# Patient Record
Sex: Female | Born: 1970 | Race: White | Hispanic: No | Marital: Married | Smoking: Former smoker
Health system: Southern US, Community
[De-identification: ages and names within clinical notes are randomized; demographics above are authoritative.]

## PROBLEM LIST (undated history)

## (undated) DIAGNOSIS — M1990 Unspecified osteoarthritis, unspecified site: Secondary | ICD-10-CM

## (undated) DIAGNOSIS — C4490 Unspecified malignant neoplasm of skin, unspecified: Secondary | ICD-10-CM

## (undated) DIAGNOSIS — I10 Essential (primary) hypertension: Secondary | ICD-10-CM

## (undated) DIAGNOSIS — C20 Malignant neoplasm of rectum: Secondary | ICD-10-CM

## (undated) HISTORY — DX: Essential (primary) hypertension: I10

## (undated) HISTORY — DX: Malignant neoplasm of rectum: C20

## (undated) HISTORY — DX: Unspecified osteoarthritis, unspecified site: M19.90

## (undated) HISTORY — PX: SKIN CANCER EXCISION: SHX779

## (undated) HISTORY — DX: Unspecified malignant neoplasm of skin, unspecified: C44.90

---

## 2003-04-08 ENCOUNTER — Other Ambulatory Visit: Admission: RE | Admit: 2003-04-08 | Discharge: 2003-04-08 | Payer: Self-pay | Admitting: Obstetrics and Gynecology

## 2004-07-10 ENCOUNTER — Other Ambulatory Visit: Admission: RE | Admit: 2004-07-10 | Discharge: 2004-07-10 | Payer: Self-pay | Admitting: Obstetrics and Gynecology

## 2008-05-14 ENCOUNTER — Encounter: Admission: RE | Admit: 2008-05-14 | Discharge: 2008-05-14 | Payer: Self-pay | Admitting: Gynecology

## 2019-02-23 DIAGNOSIS — R202 Paresthesia of skin: Secondary | ICD-10-CM | POA: Insufficient documentation

## 2019-07-18 ENCOUNTER — Encounter: Payer: Self-pay | Admitting: Nurse Practitioner

## 2019-07-24 ENCOUNTER — Institutional Professional Consult (permissible substitution): Payer: Self-pay | Admitting: Pulmonary Disease

## 2019-07-25 NOTE — Progress Notes (Signed)
07/25/2019 Kayla Hobbs [id number] 01/29/1971   CHIEF COMPLAINT: rectal bleeding   HISTORY OF PRESENT ILLNESS:  Kayla Hobbs is a 49 year old female with a past medical history of  anxiety and hypertension. S/P scalp excision for basal cell cancer one week ago. Past tubal ligation. She was referred to our office by her gynecologist, Dr. Dian Queen, for further evaluation regarding rectal bleeding.  She reports seeing bright red blood on the toilet tissue and intermittently in the toilet water 8 out of 10 bowel movements for the past year.  No severe bleeding. No associated anal or rectal pain.  No lower abdominal pain.  Her bowel pattern varies but she primarily passes a normal formed brown bowel movement every other day.  She has occasional constipation or loose stool.  One year ago she had frequent headaches for which she took Aleve, Ibuprofen, Goody powder or Tylenol  Daily for 6 months.  Her headaches improved therefore she is taking NSAIDs infrequently for the past 6 months.  No GERD or gastritis symptoms. Her weight is stable.  No family history of colorectal cancer.  No other complaints today.  Family history: Mother's history unknown.  Father age 81 with history of hypertension and hypercholesterolemia.  Paternal grandmother with diabetes.  Maternal grandmother died from pancreatic cancer in her 69s.  Social history: She is a Therapist, sports carrier.  She is divorced.  She has 3 daughters.  She previously smoked 10 cigarettes daily on and off for 10 years, she quit smoking 4 years ago.  She drinks a total of 10 glasses of wine or beer monthly.  No drug use.  Family History:  Mother. Father 34 HTN and elevated cholesterol. PGM diabetes. MGM died from pancreatic cancer 60's. No family history or esophageal, gastric or colorectal cancer.   Current Outpatient Medications on File Prior to Visit  Medication Sig Dispense Refill  . buPROPion (WELLBUTRIN SR) 150 MG 12 hr tablet Take 150 mg  by mouth 2 (two) times daily.    . busPIRone (BUSPAR) 10 MG tablet Take 10 mg by mouth 2 (two) times daily.    Marland Kitchen estradiol (VIVELLE-DOT) 0.05 MG/24HR patch Place 1 patch onto the skin 2 (two) times a week.    . fluticasone (FLONASE) 50 MCG/ACT nasal spray Place 1 spray into both nostrils daily.    . hydrochlorothiazide (HYDRODIURIL) 50 MG tablet Take 50 mg by mouth daily.    Marland Kitchen PROGESTERONE MICRONIZED PO Take 100 mg by mouth daily.    Marland Kitchen zolpidem (AMBIEN) 10 MG tablet Take 10 mg by mouth at bedtime as needed for sleep.     No current facility-administered medications on file prior to visit.   No Known Allergies  REVIEW OF SYSTEMS:  Gen: Denies fever, sweats or chills. No weight loss.  CV: Denies chest pain, palpitations or edema. Resp: Denies cough, shortness of breath of hemoptysis.  GI: See HPI. Denies heartburn, dysphagia, stomach or lower abdominal pain.  GU : Denies urinary burning, blood in urine, increased urinary frequency or incontinence. MS: Denies joint pain, muscles aches or weakness. Derm: Denies rash, itchiness, skin lesions or unhealing ulcers. Psych: + work related anxiety. No depression.  Heme: Denies bruising, bleeding. Neuro:  + Past headaches, dizziness or paresthesias. Endo:  Denies any problems with DM, thyroid or adrenal function.   PHYSICAL EXAM: BP 120/88   Pulse 87   Temp 97.8 F (36.6 C)   Ht 5\' 6"  (1.676 m)   Wt 154  lb (69.9 kg)   BMI 24.86 kg/m  General: Well developed  49 year old female in no acute distress. Head: Normocephalic and atraumatic. Eyes:  Sclerae non-icteric, conjunctive pink. Ears: Normal auditory acuity. Mouth: Dentition intact. No ulcers or lesions.  Neck: Supple, no lymphadenopathy or thyromegaly.  Lungs: Clear bilaterally to auscultation without wheezes, crackles or rhonchi. Heart: Regular rate and rhythm. No murmur, rub or gallop appreciated.  Abdomen: Soft, nontender, non distended. No masses. No hepatosplenomegaly.  Normoactive bowel sounds x 4 quadrants.  Rectal: No external hemorrhoids or fissures. Internal hemorrhoids palpated R > L. No blood or stool in the rectum. No mass.  Musculoskeletal: Symmetrical with no gross deformities. Skin: Warm and dry. No rash or lesions on visible extremities. Extremities: No edema. Neurological: Alert oriented x 4, no focal deficits.  Psychological:  Alert and cooperative. Normal mood and affect.  ASSESSMENT AND PLAN:  36. 49 year old female with hematochezia, nonbleeding internal hemorrhoids identified on rectal exam. -Colonoscopy benefits and risks discussed including risk with sedation, risk of bleeding, perforation and infection  -Request copy of CBC, CMP from Dr. Christen Butter office -Anusol [facility] 25mg  one suppository PR Q HS x 5 nights  -Patient to call our office if her rectal bleeding worsens -Further follow up to be determined after colonoscopy completed         CC:  Dian Queen, MD

## 2019-07-26 ENCOUNTER — Ambulatory Visit: Payer: 59 | Admitting: Nurse Practitioner

## 2019-07-26 ENCOUNTER — Encounter: Payer: Self-pay | Admitting: Nurse Practitioner

## 2019-07-26 VITALS — BP 120/88 | HR 87 | Temp 97.8°F | Ht 66.0 in | Wt 154.0 lb

## 2019-07-26 DIAGNOSIS — K921 Melena: Secondary | ICD-10-CM | POA: Diagnosis not present

## 2019-07-26 DIAGNOSIS — Z01818 Encounter for other preprocedural examination: Secondary | ICD-10-CM | POA: Diagnosis not present

## 2019-07-26 MED ORDER — NA SULFATE-K SULFATE-MG SULF 17.5-3.13-1.6 GM/177ML PO SOLN: 1.0000 | Freq: Once | ORAL | 0 refills | Status: AC

## 2019-07-26 MED ORDER — HYDROCORTISONE ACETATE 25 MG RE SUPP: 25.0000 mg | Freq: Every day | RECTAL | 0 refills | Status: AC

## 2019-07-26 NOTE — Patient Instructions (Addendum)
If you are age 51 or older, your body mass index should be between 23-30. Your Body mass index is 24.86 kg/m. If this is out of the aforementioned range listed, please consider follow up with your Primary Care Provider.  If you are age 74 or younger, your body mass index should be between 19-25. Your Body mass index is 24.86 kg/m. If this is out of the aformentioned range listed, please consider follow up with your Primary Care Provider.   We have sent the following medications to your pharmacy for you to pick up at your convenience: suprep (colonoscopy prep) Annusol suppositories use at bedtime for 5 nights  Please purchase the following medications over the counter and take as directed:  Please use Benefier 1 tablespoon daily Miralax as needed for constipation at bedtime.  Due to recent changes in healthcare laws, you may see the results of your imaging and laboratory studies on MyChart before your provider has had a chance to review them.  We understand that in some cases there may be results that are confusing or concerning to you. Not all laboratory results come back in the same time frame and the provider may be waiting for multiple results in order to interpret others.  Please give [place] 48 hours in order for your provider to thoroughly review all the results before contacting the office for clarification of your results.    Thank you for choosing [facility] Noralyn Pick, CRNP

## 2019-07-27 NOTE — Progress Notes (Signed)
Agree with assessment and plan as outlined.  

## 2019-08-17 ENCOUNTER — Institutional Professional Consult (permissible substitution): Payer: Self-pay | Admitting: Pulmonary Disease

## 2019-09-07 ENCOUNTER — Encounter: Payer: 59 | Admitting: Gastroenterology

## 2020-05-10 HISTORY — PX: PARTIAL HYSTERECTOMY: SHX80

## 2021-05-26 ENCOUNTER — Encounter: Payer: Self-pay | Admitting: Nurse Practitioner

## 2021-05-26 ENCOUNTER — Other Ambulatory Visit (INDEPENDENT_AMBULATORY_CARE_PROVIDER_SITE_OTHER): Payer: BLUE CROSS/BLUE SHIELD

## 2021-05-26 ENCOUNTER — Ambulatory Visit: Payer: BLUE CROSS/BLUE SHIELD | Admitting: Nurse Practitioner

## 2021-05-26 VITALS — BP 126/82 | HR 96 | Ht 64.0 in | Wt 158.6 lb

## 2021-05-26 DIAGNOSIS — K625 Hemorrhage of anus and rectum: Secondary | ICD-10-CM

## 2021-05-26 DIAGNOSIS — K648 Other hemorrhoids: Secondary | ICD-10-CM | POA: Diagnosis not present

## 2021-05-26 DIAGNOSIS — Z1211 Encounter for screening for malignant neoplasm of colon: Secondary | ICD-10-CM | POA: Insufficient documentation

## 2021-05-26 LAB — CBC
HCT: 42 % (ref 36.0–46.0)
Hemoglobin: 13.9 g/dL (ref 12.0–15.0)
MCHC: 33 g/dL (ref 30.0–36.0)
MCV: 92.6 fl (ref 78.0–100.0)
Platelets: 220 10*3/uL (ref 150.0–400.0)
RBC: 4.54 Mil/uL (ref 3.87–5.11)
RDW: 13.3 % (ref 11.5–15.5)
WBC: 6.3 10*3/uL (ref 4.0–10.5)

## 2021-05-26 MED ORDER — NA SULFATE-K SULFATE-MG SULF 17.5-3.13-1.6 GM/177ML PO SOLN: 1.0000 | ORAL | 0 refills | Status: DC

## 2021-05-26 NOTE — Patient Instructions (Addendum)
You have been scheduled for a colonoscopy. Please follow written instructions given to you at your visit today.  Please pick up your prep supplies at the pharmacy within the next 1-3 days. If you use inhalers (even only as needed), please bring them with you on the day of your procedure.  We have sent the following medications to your pharmacy for you to pick up at your convenience: [place] provider has requested that you go to the basement level for lab work before leaving today. Press "B" on the elevator. The lab is located at the first door on the left as you exit the elevator.   1.A high fiber diet with plenty of fluids (up to 8 glasses of water daily) is suggested to relieve these symptoms. Take Benefiber 1 tablespoon daily to help with having regular bowel movement daily.   2.Try Hardin Negus Bacteria Probiotic ( over the counter) 1 tablet by mouth once daily.   3.Apply pea size amount of Desitin inside the anal area three times daily as needed for Hemorrhoids.   If you are age 109 or older, your body mass index should be between 23-30. Your Body mass index is 27.22 kg/m. If this is out of the aforementioned range listed, please consider follow up with your Primary Care Provider.  If you are age 53 or younger, your body mass index should be between 19-25. Your Body mass index is 27.22 kg/m. If this is out of the aformentioned range listed, please consider follow up with your Primary Care Provider.   ________________________________________________________  The [facility] providers would like to encourage you to use [facility] to communicate with providers for non-urgent requests or questions.  Due to long hold times on the telephone, sending your provider a message by [facility] may be a faster and more efficient way to get a response.  Please allow 48 business hours for a response.  Please remember that this is for non-urgent requests.   _______________________________________________________  Due to recent changes in healthcare laws, you may see the results of your imaging and laboratory studies on MyChart before your provider has had a chance to review them.  We understand that in some cases there may be results that are confusing or concerning to you. Not all laboratory results come back in the same time frame and the provider may be waiting for multiple results in order to interpret others.  Please give [place] 48 hours in order for your provider to thoroughly review all the results before contacting the office for clarification of your results.   Thank you for choosing me and [facility].  Cyril

## 2021-05-26 NOTE — Progress Notes (Signed)
05/26/2021 Kayla Hobbs [id number] 07/06/1970   Chief Complaint: Schedule colonoscopy  History of Present Illness: Kayla Hobbs is a 51 year old female with a past medical history of anxiety and hypertension. Partial hysterectomy 05/2020, pat tubal ligation and scalp excision for basal cell cancer. I last saw her in office on 07/26/2019 due to having bright red rectal bleeding. She was prescribed Anusol suppositories for internal hemorrhoids and a colonoscopy was ordered but was not done. She presents to our office today to schedule colonoscopy.  She reports passing bright red blood daily when passing a bowel movement.  She describes seeing a small amount of bright red blood on the stool and on the toilet tissue.  No associated anorectal pain.  She sometimes sees blood on the toilet tissue when she urinates and does not pass any stool per the rectum.  She has generalized abdominal pain if she delays defecation.  She took 1 Anusol suppositories previously prescribed but did not feel comfortable administering the rest of the suppositories.  Her bowel pattern varies, she passes a solid or loose stool daily then no bowel movement for few days then the cycle repeats.  No known family history of colon polyps or colon cancer.  She takes Aleve 2 tabs as needed for aches and pains.  No GERD symptoms.  She is scheduled to see her new PCP Dr. Radford Pax at [facility] later today.  Past Medical History:  Diagnosis Date   Arthritis    Skin cancer    basil top of heasd    Past Surgical History:  Procedure Laterality Date   SKIN CANCER EXCISION     Current Outpatient Medications on File Prior to Visit  Medication Sig Dispense Refill   buPROPion (WELLBUTRIN SR) 150 MG 12 hr tablet Take 150 mg by mouth 2 (two) times daily.     busPIRone (BUSPAR) 10 MG tablet Take 10 mg by mouth 2 (two) times daily.     estradiol (ESTRACE) 0.5 MG tablet Take 0.5 mg by mouth daily.     hydrochlorothiazide  (HYDRODIURIL) 50 MG tablet Take 50 mg by mouth daily.     PROGESTERONE MICRONIZED PO Take 100 mg by mouth daily.     No current facility-administered medications on file prior to visit.   No Known Allergies  Current Medications, Allergies, Past Medical History, Past Surgical History, Family History and Social History were reviewed in [employer] record.  Review of Systems:   Constitutional: Negative for fever, sweats, chills or weight loss.  Respiratory: Negative for shortness of breath.   Cardiovascular: Negative for chest pain, palpitations and leg swelling.  Gastrointestinal: See HPI.  Musculoskeletal: Negative for back pain or muscle aches.  Neurological: Negative for dizziness, headaches or paresthesias.    Physical Exam: BP 126/82    Pulse 96    Ht 5\' 4"  (1.626 m)    Wt 158 lb 9.6 oz (71.9 kg)    BMI 27.22 kg/m   General: 51 year old female in no acute distress. Head: Normocephalic and atraumatic. Eyes: No scleral icterus. Conjunctiva pink . Ears: Normal auditory acuity. Mouth: Dentition intact. No ulcers or lesions.  Lungs: Clear throughout to auscultation. Heart: Regular rate and rhythm, no murmur. Abdomen: Soft, nontender and nondistended. No masses or hepatomegaly. Normal bowel sounds x 4 quadrants.  Rectal: Patient declined rectal exam, wishes to proceed with colonoscopy for further evaluation. Musculoskeletal: Symmetrical with no gross deformities. Extremities: No edema. Neurological: Alert oriented x  4. No focal deficits.  Psychological: Alert and cooperative. Normal mood and affect  Assessment and Recommendations:  76) 51 year old female with rectal bleeding, internal hemorrhoids.  Colonoscopy was previously recommended 07/2019 but was not done.  She wishes to schedule colonoscopy at this time. -Colonoscopy benefits and risks discussed including risk with sedation, risk of bleeding, perforation and infection  -Apply a small amount of Desitin  inside the anal opening and to the external anal area tid as needed for anal or hemorrhoidal irritation/bleeding.  -CBC -Patient to contact her office if her rectal bleeding worsens prior to her colonoscopy date  2) IBS symptoms with variable bowel pattern -Proceed with colonoscopy as ordered above -Benefiber 1 tablespoon daily as tolerated -Ph[place]ips bacteria probiotic once daily  Further recommendations to be determined after the above evaluation completed

## 2021-05-27 NOTE — Progress Notes (Signed)
Agree with assessment and plan as outlined.  

## 2021-06-18 ENCOUNTER — Encounter: Payer: Self-pay | Admitting: Gastroenterology

## 2021-06-18 ENCOUNTER — Ambulatory Visit (AMBULATORY_SURGERY_CENTER): Payer: BLUE CROSS/BLUE SHIELD | Admitting: Gastroenterology

## 2021-06-18 VITALS — BP 120/79 | HR 77 | Temp 98.0°F | Resp 11 | Ht 64.0 in | Wt 158.0 lb

## 2021-06-18 DIAGNOSIS — D125 Benign neoplasm of sigmoid colon: Secondary | ICD-10-CM

## 2021-06-18 DIAGNOSIS — C20 Malignant neoplasm of rectum: Secondary | ICD-10-CM | POA: Diagnosis not present

## 2021-06-18 DIAGNOSIS — D128 Benign neoplasm of rectum: Secondary | ICD-10-CM

## 2021-06-18 DIAGNOSIS — D122 Benign neoplasm of ascending colon: Secondary | ICD-10-CM | POA: Diagnosis not present

## 2021-06-18 DIAGNOSIS — A630 Anogenital (venereal) warts: Secondary | ICD-10-CM

## 2021-06-18 DIAGNOSIS — K625 Hemorrhage of anus and rectum: Secondary | ICD-10-CM | POA: Diagnosis present

## 2021-06-18 MED ORDER — SODIUM CHLORIDE 0.9 % IV SOLN: 500.0000 mL | Freq: Once | INTRAVENOUS | Status: DC

## 2021-06-18 NOTE — Progress Notes (Signed)
Pt awake, report to RN, VVS  °

## 2021-06-18 NOTE — Progress Notes (Signed)
History and Physical Interval Note: Patient here for colonoscopy to evaluate rectal bleeding which has persistent since the office visit on 05/26/21. No interval changes since her visit. She feels well otherwise without complaints. She thinks bleeding is due to hemorrhoids, but will confirm with colonoscopy, this is her first exam. No FH of CRC. Otherwise feels well.   06/18/2021 3:45 PM  Kayla Hobbs  has presented today for endoscopic procedure(s), with the diagnosis of  Encounter Diagnosis  Name Primary?   Rectal bleeding Yes  .  The various methods of evaluation and treatment have been discussed with the patient and/or family. After consideration of risks, benefits and other options for treatment, the patient has consented to  the endoscopic procedure(s).   [name] history has been reviewed, patient examined, no change in status, stable for surgery.  I have reviewed the patient's chart and labs.  Questions were answered to the patient's satisfaction.    Jolly Mango, MD [facility]

## 2021-06-18 NOTE — Progress Notes (Signed)
Called to room to assist during endoscopic procedure.  Patient ID and intended procedure confirmed with present staff. Received instructions for my participation in the procedure from the performing physician.  

## 2021-06-18 NOTE — Progress Notes (Signed)
Pt's states no medical or surgical changes since previsit or office visit.   Vs dt.

## 2021-06-18 NOTE — Op Note (Signed)
[facility] Patient Name: Kayla Hobbs Procedure Date: 06/18/2021 3:43 PM MRN: [id number] Endoscopist: Remo Lipps P. Havery [name] , MD Age: 51 Referring MD:  Date of Birth: 05-03-1971 Gender: Female Account #: [id number] Procedure:                Colonoscopy Indications:              Rectal bleeding, first time colonoscopy Medicines:                Monitored Anesthesia Care Procedure:                Pre-Anesthesia Assessment:                           - Prior to the procedure, a History and Physical                            was performed, and patient medications and                            allergies were reviewed. [name] tolerance of                            previous anesthesia was also reviewed. The risks                            and benefits of the procedure and the sedation                            options and risks were discussed with the patient.                            All questions were answered, and informed consent                            was obtained. Prior Anticoagulants: The patient has                            taken no previous anticoagulant or antiplatelet                            agents. ASA Grade Assessment: II - A patient with                            mild systemic disease. After reviewing the risks                            and benefits, the patient was deemed in                            satisfactory condition to undergo the procedure.                           After obtaining informed consent, the colonoscope  was passed under direct vision. Throughout the                            procedure, the patient's blood pressure, pulse, and                            oxygen saturations were monitored continuously. The                            PCF-HQ190L Colonoscope was introduced through the                            anus and advanced to the the cecum, identified by                            appendiceal  orifice and ileocecal valve. The                            colonoscopy was performed without difficulty. The                            patient tolerated the procedure well. The quality                            of the bowel preparation was good. The ileocecal                            valve, appendiceal orifice, and rectum were                            photographed. Scope In: 3:48:43 PM Scope Out: 4:24:28 PM Scope Withdrawal Time: 0 hours 22 minutes 12 seconds  Total Procedure Duration: 0 hours 35 minutes 45 seconds  Findings:                 The digital rectal exam findings include polypoid                            lesion.                           A diminutive polyp was found in the ascending                            colon. The polyp was sessile. The polyp was removed                            with a cold biopsy forceps. Resection and retrieval                            were complete.                           A 5 mm polyp was found in the sigmoid colon. The  polyp was sessile. The polyp was removed with a                            cold snare. Resection and retrieval were complete.                           A large polyp was found in the distal rectum (3cm                            or so) within 2-3 cm of the dentate line. The polyp                            appeared to be pedunculated but with a very short                            stalk. The stalk was successfully injected with 8                            mL of a 1:100,000 solution of epinephrine for drug                            delivery during cecal intubation to shrink the head                            of it and lift it. The polyp was then removed with                            a hot snare on Endocut setting during withdrawal.                            Resection and retrieval were complete. Area on                            either side of the polypectomy site was tattooed                             with an injection of Spot (carbon black). To                            prevent bleeding after the polypectomy, two                            hemostatic clips were successfully placed across                            the polypectomy site.                           Small polypoid lesions were found in the distal                            rectum along the dentate line. The  lesions were                            sessile. Unclear if condyloma vs. small adenomas.                            Biopsies were taken with a cold forceps for                            histology.                           Internal hemorrhoids were found during                            retroflexion. The hemorrhoids were small.                           The exam was otherwise without abnormality. Several                            minutes spent preparing epinephrine for injection                            during cecal intubation, which prolonged cecal                            intubation, which was not challenging. Complications:            No immediate complications. Estimated blood loss:                            Minimal. Estimated Blood Loss:     Estimated blood loss was minimal. Impression:               - Polypoid lesion found on digital rectal exam.                           - One diminutive polyp in the ascending colon,                            removed with a cold biopsy forceps. Resected and                            retrieved.                           - One 5 mm polyp in the sigmoid colon, removed with                            a cold snare. Resected and retrieved.                           - One large polyp in the distal rectum. Resected  and retrieved as outlined. Injected / tattooed.                            Clips were placed.                           - Polypoid lesions in the distal rectum. Biopsied.                           - Internal  hemorrhoids.                           - The examination was otherwise normal. Recommendation:           - Patient has a contact number available for                            emergencies. The signs and symptoms of potential                            delayed complications were discussed with the                            patient. Return to normal activities tomorrow.                            Written discharge instructions were provided to the                            patient.                           - Resume previous diet.                           - Continue present medications.                           - Await pathology results.                           - No ibuprofen, naproxen, or other non-steroidal                            anti-inflammatory drugs for 2 weeks after polyp                            removal. Remo Lipps P. Rosalio Catterton, MD 06/18/2021 4:37:14 PM This report has been signed electronically.

## 2021-06-18 NOTE — Patient Instructions (Signed)
Handout given for polyps.  No NSAIDS (Non-Steroidal anti-inflammatory drugs) for 2 weeks.  (These include, aspirin, aspirin-containing products, ibuprofen, advil, motrin, naproxen, aleve, goody powders, etc) Tylenol is ok to take as needed, see label for instructions.    YOU HAD AN ENDOSCOPIC PROCEDURE TODAY AT [facility]:   Refer to the procedure report that was given to you for any specific questions about what was found during the examination.  If the procedure report does not answer your questions, please call your gastroenterologist to clarify.  If you requested that your care partner not be given the details of your procedure findings, then the procedure report has been included in a sealed envelope for you to review at your convenience later.  YOU SHOULD EXPECT: Some feelings of bloating in the abdomen. Passage of more gas than usual.  Walking can help get rid of the air that was put into your GI tract during the procedure and reduce the bloating. If you had a lower endoscopy (such as a colonoscopy or flexible sigmoidoscopy) you may notice spotting of blood in your stool or on the toilet paper. If you underwent a bowel prep for your procedure, you may not have a normal bowel movement for a few days.  Please Note:  You might notice some irritation and congestion in your nose or some drainage.  This is from the oxygen used during your procedure.  There is no need for concern and it should clear up in a day or so.  SYMPTOMS TO REPORT IMMEDIATELY:  Following lower endoscopy (colonoscopy):  Excessive amounts of blood in the stool  Significant tenderness or worsening of abdominal pains  Swelling of the abdomen that is new, acute  Fever of 100F or higher  For urgent or emergent issues, a gastroenterologist can be reached at any hour by calling [phone]. Do not use MyChart messaging for urgent concerns.    DIET:  We do recommend a small meal at first, but then you may  proceed to your regular diet.  Drink plenty of fluids but you should avoid alcoholic beverages for 24 hours.  ACTIVITY:  You should plan to take it easy for the rest of today and you should NOT DRIVE, NO WORK or use heavy machinery until tomorrow (because of the sedation medicines used during the test).    FOLLOW UP: Our staff will call the number listed on your records Monday 06/22/21 between 715-8AM following your procedure to check on you and address any questions or concerns that you may have regarding the information given to you following your procedure. If we do not reach you, we will leave a message.  We will attempt to reach you two times.  During this call, we will ask if you have developed any symptoms of COVID 19. If you develop any symptoms (ie: fever, flu-like symptoms, shortness of breath, cough etc.) before then, please call [phone].  If you test positive for Covid 19 in the 2 weeks post procedure, please call and report this information to [place].     The polyps and biopsies that were taken are sent to the pathology lab and you will be contacted by phone or by letter within the next 1-3 weeks.  Please call us at [phone] if you have not heard about the biopsies in 3 weeks.    SIGNATURES/CONFIDENTIALITY: You and/or your care partner have signed paperwork which will be entered into your electronic medical record.  These signatures attest to the fact  that that the information above on your After Visit Summary has been reviewed and is understood.  Full responsibility of the confidentiality of this discharge information lies with you and/or your care-partner.

## 2021-06-22 ENCOUNTER — Telehealth: Payer: Self-pay

## 2021-06-22 NOTE — Telephone Encounter (Signed)
°  Follow up Call-  Call back number 06/18/2021  Post procedure Call Back phone  # [phone]  Permission to leave phone message Yes  Some recent data might be hidden     Patient questions:  Do you have a fever, pain , or abdominal swelling? No. Pain Score  0 *  Have you tolerated food without any problems? Yes.    Have you been able to return to your normal activities? Yes.    Do you have any questions about your discharge instructions: Diet   No. Medications  No. Follow up visit  No.  Do you have questions or concerns about your Care? No.  Actions: * If pain score is 4 or above: No action needed, pain <4.

## 2021-06-26 ENCOUNTER — Telehealth: Payer: Self-pay | Admitting: Oncology

## 2021-06-26 ENCOUNTER — Telehealth: Payer: Self-pay | Admitting: Nurse Practitioner

## 2021-06-26 ENCOUNTER — Other Ambulatory Visit: Payer: Self-pay

## 2021-06-26 DIAGNOSIS — C20 Malignant neoplasm of rectum: Secondary | ICD-10-CM

## 2021-06-26 NOTE — Telephone Encounter (Signed)
Scheduled appt per 2/17 referral. Pt is aware of appt date and time. Pt is aware to arrive 15 mins prior to appt time and to bring and updated insurance card. Pt is aware of appt location.   °

## 2021-07-02 ENCOUNTER — Other Ambulatory Visit: Payer: Self-pay

## 2021-07-02 ENCOUNTER — Ambulatory Visit (HOSPITAL_COMMUNITY)
Admission: RE | Admit: 2021-07-02 | Discharge: 2021-07-02 | Disposition: A | Discharge status: Home / Self Care | Payer: BLUE CROSS/BLUE SHIELD | Source: Ambulatory Visit | Attending: Gastroenterology | Admitting: Gastroenterology

## 2021-07-02 ENCOUNTER — Encounter (HOSPITAL_COMMUNITY): Payer: Self-pay

## 2021-07-02 DIAGNOSIS — C20 Malignant neoplasm of rectum: Secondary | ICD-10-CM | POA: Diagnosis present

## 2021-07-02 LAB — POCT I-STAT CREATININE: Creatinine, Ser: 1.2 mg/dL — ABNORMAL HIGH (ref 0.44–1.00)

## 2021-07-02 MED ORDER — IOHEXOL 300 MG/ML  SOLN
100.0000 mL | Freq: Once | INTRAMUSCULAR | Status: AC | PRN
  Administered 2021-07-02: 100 mL via INTRAVENOUS

## 2021-07-02 MED ORDER — SODIUM CHLORIDE (PF) 0.9 % IJ SOLN
INTRAMUSCULAR | Status: AC
  Filled 2021-07-02: qty 50

## 2021-07-03 ENCOUNTER — Ambulatory Visit (HOSPITAL_COMMUNITY)
Admission: RE | Admit: 2021-07-03 | Discharge: 2021-07-03 | Disposition: A | Discharge status: Home / Self Care | Payer: BLUE CROSS/BLUE SHIELD | Source: Ambulatory Visit | Attending: Gastroenterology | Admitting: Gastroenterology

## 2021-07-03 DIAGNOSIS — C20 Malignant neoplasm of rectum: Secondary | ICD-10-CM | POA: Insufficient documentation

## 2021-07-03 MED ORDER — GADOBUTROL 1 MMOL/ML IV SOLN
7.0000 mL | Freq: Once | INTRAVENOUS | Status: AC | PRN
  Administered 2021-07-03: 7 mL via INTRAVENOUS

## 2021-07-06 ENCOUNTER — Other Ambulatory Visit (HOSPITAL_COMMUNITY): Payer: BLUE CROSS/BLUE SHIELD

## 2021-07-08 ENCOUNTER — Encounter: Payer: Self-pay | Admitting: Nurse Practitioner

## 2021-07-08 ENCOUNTER — Other Ambulatory Visit: Payer: Self-pay

## 2021-07-08 ENCOUNTER — Telehealth: Payer: Self-pay | Admitting: Radiation Oncology

## 2021-07-08 ENCOUNTER — Inpatient Hospital Stay: Payer: BLUE CROSS/BLUE SHIELD | Attending: Nurse Practitioner | Admitting: Nurse Practitioner

## 2021-07-08 DIAGNOSIS — Z806 Family history of leukemia: Secondary | ICD-10-CM | POA: Diagnosis not present

## 2021-07-08 DIAGNOSIS — Z87891 Personal history of nicotine dependence: Secondary | ICD-10-CM | POA: Insufficient documentation

## 2021-07-08 DIAGNOSIS — F1090 Alcohol use, unspecified, uncomplicated: Secondary | ICD-10-CM | POA: Diagnosis not present

## 2021-07-08 DIAGNOSIS — Z800 Family history of malignant neoplasm of digestive organs: Secondary | ICD-10-CM | POA: Diagnosis not present

## 2021-07-08 DIAGNOSIS — I10 Essential (primary) hypertension: Secondary | ICD-10-CM | POA: Insufficient documentation

## 2021-07-08 DIAGNOSIS — Z808 Family history of malignant neoplasm of other organs or systems: Secondary | ICD-10-CM | POA: Insufficient documentation

## 2021-07-08 DIAGNOSIS — C20 Malignant neoplasm of rectum: Secondary | ICD-10-CM | POA: Diagnosis present

## 2021-07-08 DIAGNOSIS — F1910 Other psychoactive substance abuse, uncomplicated: Secondary | ICD-10-CM | POA: Insufficient documentation

## 2021-07-08 NOTE — Progress Notes (Addendum)
I met with Ms Kayla Hobbs and her husband after her consultation with Cira Rue, NP and Dr Burr Medico.   I explained my role as a nurse navigator and provided my contact information.  I explained the services provided at [facility] and provided written information.  All questions were answered.  They verbalized understanding. ?I spoke with Rosann Auerbach at [facility] laboratories and requested MMR testing on her biopsy specimen. ? ?

## 2021-07-08 NOTE — Progress Notes (Signed)
The proposed treatment discussed in conference is for discussion purpose only and is not a binding recommendation.  The patients have not been physically examined, or presented with their treatment options.  Therefore, final treatment plans cannot be decided.  

## 2021-07-08 NOTE — Progress Notes (Addendum)
[facility]   Telephone:([phone]) [phone] [phone]) [phone]   Clinic New Consult Note   Patient Care Team: Patient, No Pcp Per (Inactive) as PCP - General (General Practice) Romie Levee, MD as Consulting Physician (General Surgery) Malachy Mood, MD as Consulting Physician (Hematology) Marily Lente, RN as Registered Nurse Pollyann Samples, NP as Nurse Practitioner (Nurse Practitioner) 07/08/2021  CHIEF COMPLAINTS/PURPOSE OF CONSULTATION:  Rectal cancer, referred by colorectal surgeon Dr. Romie Levee  SUMMARY OF ONCOLOGY HISTORY  Oncology History  Rectal cancer (HCC)  06/18/2021 Cancer Staging   Staging form: Colon and Rectum, AJCC 8th Edition - Clinical stage from 06/18/2021: Stage I (cT1, cN0, cM0) - Signed by Malachy Mood, MD on 07/11/2021 Stage prefix: Initial diagnosis Total positive nodes: 0 Histologic grade (G): G2 Histologic grading system: 4 grade system    06/18/2021 Procedure   Colonoscopy per Dr. Adela Lank, impression - Polypoid lesion found on digital rectal exam. - One diminutive polyp in the ascending colon, removed with a cold biopsy forceps. Resected and retrieved. - One 5 mm polyp in the sigmoid colon, removed with a cold snare. Resected and retrieved. - One large polyp in the distal rectum. Resected and retrieved as outlined. Injected/tattooed. Clips were placed. - Polypoid lesions in the distal rectum. Biopsied. - Internal hemorrhoids. - The examination was otherwise normal.   06/18/2021 Initial Biopsy   Diagnosis 1. Surgical [P], colon, distal rectal biopsy - CONDYLOMA ACUMINATA 2. Surgical [P], colon, ascending, sigmoid, polyp (2) - TUBULAR ADENOMA (2 OF 2 FRAGMENTS) - NO HIGH-GRADE DYSPLASIA OR MALIGNANCY IDENTIFIED 3. Surgical [P], colon, rectum, polyp (1) - ADENOCARCINOMA ARISING IN A TUBULAR ADENOMA WITH HIGH-GRADE DYSPLASIA - SEE COMMENT Microscopic Comment 3. The invasive adenocarcinoma appears moderately differentiated and invades the  submucosa for a depth of 0.8 cm. The adenocarcinoma comes within 0.1 cm from the base of the polyp. High-grade features including lymphovascular space invasion, tumor budding or poor differentiation are not identified. Dr. Kenard Gower reviewed the case and agrees with the above diagnosis.   07/02/2021 Imaging   CT CAP IMPRESSION: Negative. No evidence of metastatic disease or other significant abnormality.   07/03/2021 Imaging   Pelvic MRI w/wo contrast IMPRESSION: Rectal adenocarcinoma T stage: T2.  Lesion not identified  Rectal adenocarcinoma N stage:  N0  Distance from tumor to the internal anal sphincter. Lesion not identified   07/08/2021 Initial Diagnosis   Rectal cancer (HCC)      HISTORY OF PRESENTING ILLNESS:  Kayla Hobbs 51 y.o. female with no significant past medical history except HTN is here because of newly diagnosed rectal cancer.  Initially seen by GI 07/26/2019 for BRBPR, prescribed Anusol but did not take it, and was scheduled for a colonoscopy but canceled it.  She has had persistent rectal bleeding since then with irregular bowel movements and occasional abdominal cramping.  She was seen back by GI 05/26/2021.  CBC was normal that day.  She underwent colonoscopy by Dr. Adela Lank 06/18/2021 which showed a polypoid lesion on the DRE and a large 3 cm pedunculated polyp on a stalk at the distal rectum within 2-3 cm of the dentate line, and multiple other polyps.  Resection and retrieval were felt to be complete.  The rectal polyp path showed moderately differentiated adenocarcinoma invading into the submucosa to a depth of 8 mm arising in a tubular adenoma with high-grade dysplasia.  Path also showed anal condyloma acuminata, and ascending and sigmoid polyps were tubular adenomas.  Staging CT CAP negative for local regional  adenopathy or distant metastasis.  Pelvic MRI 07/03/2021 showed T2N0, although there was no identifiable primary lesion, this likely represents inflammation from  polypectomy per tumor board discussion on 07/08/2021.  She was seen by colorectal surgeon Dr. Romie Levee.  Socially, she is recently married for 1 year, has 3 grown healthy daughters the oldest of whom is 60.  She smoked cigarettes intermittently for 10-15 years, quit 15 years ago at the most smoked 5 cigarettes/day.  She drinks wine daily after work.  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  She is independent with ADLs and up-to-date on mammogram.  She had a hysterectomy 1 year ago for heavy bleeding and takes Estrace for hot flashes.  Family history is significant for a cousin with leukemia, MGM with pancreatic cancer, and a half brother (share a father) with liver cancer likely HCC.  Patient had genetic testing at her OB/[facility]N [facility].   Today, she presents with her spouse. Rectal bleeding stopped with polypectomy, but was daily before that. Takes a multivitamin. She has irregular BMs, with soft to loose stools.  She has healed well from polypectomy.  Denies rectal pain, n/v, abdominal pain/bloating, unintentional weight loss, fatigue, fever, chills, cough, chest pain, dyspnea.     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  Tobacco Use   Smoking status: Former    Types: Cigarettes   Smokeless tobacco: Never   Tobacco comments:    Smoked intermittently up to 5 cig per day for 10-15 years, quit 5 years ago  Haematologist Use: Never used  Substance and Sexual Activity   Alcohol use: Yes    Comment: wine, daily   Drug use: Never   Sexual activity: Yes  Other Topics Concern   Not on file  Social History Narrative   Not on file   Social Determinants of Health   Financial Resource Strain: Not on file  Food Insecurity: Not on file  Transportation Needs: Not on file  Physical Activity: Not on file  Stress: Not on file  Social Connections: Not on file  Intimate Partner Violence: Not on file    FAMILY HISTORY: Family History  Problem Relation Age of Onset   Heart disease Father    Cancer Brother        liver cancer possibly HCC   Cancer Maternal Grandmother        pancreas   Pancreatic cancer Maternal Grandmother    Diabetes Paternal Grandmother    Heart disease Paternal Grandfather    Cancer Cousin        leukemia   Colon cancer Neg Hx    Stomach cancer Neg Hx    Esophageal cancer Neg Hx     ALLERGIES:  has No Known Allergies.  MEDICATIONS:  Current Outpatient Medications  Medication Sig  Dispense Refill   buPROPion (WELLBUTRIN SR) 150 MG 12 hr tablet Take 150 mg by mouth 2 (two) times daily.     busPIRone (BUSPAR) 10 MG tablet Take 10 mg by mouth 2 (two) times daily.     estradiol (ESTRACE) 0.5 MG tablet Take 0.5 mg by mouth daily.     hydrochlorothiazide (HYDRODIURIL) 50 MG tablet Take 50 mg by mouth daily.     SUMAtriptan (IMITREX) 50 MG tablet Take 50 mg by mouth daily as needed. (Patient not taking: Reported on 07/08/2021)     No current facility-administered medications for this visit.    REVIEW OF SYSTEMS:   Constitutional: Denies fatigue, unintentional weight loss, fevers, chills or abnormal night sweats Eyes: Denies blurriness of vision, double vision or watery eyes Ears, nose, mouth, throat, and face: Denies mucositis or sore throat Respiratory: Denies cough, dyspnea or wheezes Cardiovascular: Denies palpitation, chest discomfort or lower extremity  swelling Gastrointestinal:  Denies nausea, vomiting, constipation, diarrhea, heartburn or change in bowel habits (+) BRBPR x1 year (+) irregular bowels, soft-loose Skin: Denies abnormal skin rashes Lymphatics: Denies new lymphadenopathy or easy bruising Neurological:Denies numbness, tingling or new weaknesses Behavioral/Psych: Mood is stable, no new changes  All other systems were reviewed with the patient and are negative.  PHYSICAL EXAMINATION: ECOG PERFORMANCE STATUS: 0 - Asymptomatic  Vitals:   07/08/21 1110  BP: (!) 121/94  Pulse: 90  Resp: 15  Temp: 97.8 F (36.6 C)  SpO2: 100%   Filed Weights   07/08/21 1110  Weight: 161 lb (73 kg)    GENERAL:alert, no distress and comfortable SKIN: No rash EYES: sclera clear NECK: Without mass LYMPH:  no palpable cervical or supraclavicular lymphadenopathy  LUNGS: clear with normal breathing effort HEART: regular rate & rhythm, no lower extremity edema ABDOMEN:abdomen soft, non-tender and normal bowel sounds Musculoskeletal:no cyanosis of digits and no clubbing  PSYCH: alert & oriented x 3 with fluent speech NEURO: no focal motor/sensory deficits Anorectal exam deferred   LABORATORY DATA:  I have reviewed the data as listed CBC Latest Ref Rng & Units 05/26/2021  WBC 4.0 - 10.5 K/uL 6.3  Hemoglobin 12.0 - 15.0 g/dL 34.7  Hematocrit 42.5 - 46.0 % 42.0  Platelets 150.0 - 400.0 K/uL 220.0    RADIOGRAPHIC STUDIES: I have personally reviewed the radiological images as listed and agreed with the findings in the report. MR PELVIS W WO CONTRAST  Result Date: 07/07/2021 CLINICAL DATA:  Large polypoid lesion removed from the rectum. Lesion reported 2-3 cm from the dentate line. EXAM: MRI PELVIS WITH and  WITHOUT CONTRAST TECHNIQUE: Multiplanar multisequence MR imaging of the pelvis was performed. Without contrast ultrasound gel was administered per rectum to optimize tumor evaluation. COMPARISON:  None. CONTRAST:  7 mL Gadavist  FINDINGS: TUMOR LOCATION No discrete lesion identified following polypectomy. Polypectomy within 2-3 cm the dentate line by report. Tumor distance from Anal Verge/Skin Surface: Lesion not identified Tumor distance to Internal Anal Sphincter: Lesion not identified TUMOR DESCRIPTION Circumferential Extent: Lesion knots identified Tumor Length: Lesion not identified cm T - CATEGORY Extension through Muscularis Propria: No equal T2 Shortest Distance of any tumor/node from Mesorectal Fascia: Lesion not identified mm Extramural Vascular Invasion/Tumor Thrombus: No Invasion of Anterior Peritoneal Reflection: No Involvement of Adjacent Organs or Pelvic Sidewall: No Levator Ani Involvement: No N - CATEGORY Mesorectal Lymph Nodes >=57mm: None equal in 0 Extra-mesorectal Lymphadenopathy: No Other: Small 10 mm cyst in the LEFT adnexal region. Post hysterectomy IMPRESSION: Rectal adenocarcinoma T stage: T2.  Lesion not identified Rectal adenocarcinoma N stage:  N0 Distance from tumor to the internal anal sphincter. Lesion not identified Electronically Signed   By: Genevive Bi M.D.   On: 07/07/2021 08:46   CT CHEST ABDOMEN PELVIS W CONTRAST  Result Date: 07/03/2021 CLINICAL DATA:  Newly diagnosed rectal carcinoma.  Staging. EXAM: CT CHEST, ABDOMEN, AND PELVIS WITH CONTRAST TECHNIQUE: Multidetector CT imaging of the chest, abdomen and pelvis was performed following the standard protocol during bolus administration of intravenous contrast. RADIATION DOSE REDUCTION: This exam was performed according to the departmental dose-optimization program which includes automated exposure control, adjustment of the mA and/or kV according to patient size and/or use of iterative reconstruction technique. CONTRAST:  OMNIPAQUE IOHEXOL 300 MG/ML  SOLN COMPARISON:  None. FINDINGS: CT CHEST FINDINGS Cardiovascular: No acute findings. Mediastinum/Lymph Nodes: No masses or pathologically enlarged lymph nodes identified. Lungs/Pleura: No  suspicious pulmonary nodules or masses identified. No evidence of infiltrate or pleural effusion. Musculoskeletal:  No suspicious bone lesions identified. CT ABDOMEN AND PELVIS FINDINGS Hepatobiliary: Probable tiny sub-cm cyst noted in posterior right hepatic lobe. No other liver lesions identified. Gallbladder is unremarkable. No evidence of biliary ductal dilatation. Pancreas:  No mass or inflammatory changes. Spleen:  Within normal limits in size and appearance. Adrenals/Urinary tract:  No masses or hydronephrosis. Stomach/Bowel: No focal soft tissue masses identified. No evidence of obstruction, inflammatory process, or abnormal fluid collections. Normal appendix visualized. Vascular/Lymphatic: No pathologically enlarged lymph nodes identified. No acute vascular findings. Reproductive: Prior hysterectomy noted. Adnexal regions are unremarkable in appearance. Other:  None. Musculoskeletal:  No suspicious bone lesions identified. IMPRESSION: Negative. No evidence of metastatic disease or other significant abnormality. Electronically Signed   By: Danae Orleans M.D.   On: 07/03/2021 14:28    ASSESSMENT & PLAN: 51 year old female  Moderately differentiated rectal adenocarcinoma, G2, T1N0M0 stage I -we reviewed her work up including colonoscopy, CT, MRI, and pathology in detail with the patient and family.  -She presented with rectal bleeding in 07/2019, colonoscopy was canceled by the patient. She had persistent bleeding and presented back to GI 06/2021, work up showed a distal rectal polyp palpable on DRE. Resection was complete, path showed moderately differentiated adenocarcinoma invading submucosa to a dept of 8 mm, with a close (0.1 cm) but negative margin.   -There were no other higher grade features such as lymphovascular space invasion, tumor budding, or poor differentiation.  We reviewed the concern is that at this depth, there is approximately 20% chance there is microscopic residual/nodal  disease -Staging CT CAP was negative for local regional adenopathy or distant metastasis. -Staging pelvic MRI showed local inflammation but no identifiable lesion, N0 -Her case was discussed in our GI conference today 07/08/2021, the options include either upfront surgery vs chemo RT and observation, vs observation alone which would include flex sigmoidoscopy plus CT/MRI every 3-6 months for the first 3 years out.  -Dr. Maisie Fus reviewed this would be a complex surgery with the need for at least temporary, possibly permanent, colostomy.  Patient would like to avoid surgery if possible -We reviewed logistics of chemoradiation with Xeloda given twice daily on M-F days with radiation.  Potential side effects including but not limited to fatigue, low appetite, hand/foot syndrome, skin toxicities, nausea, diarrhea, renal/liver dysfunction, and cytopenias were reviewed in detail. -Patient seen with Dr. Mosetta Putt who reviewed chemoRT could potentially reduce recurrence risk by 70-80%, but the benefit is not guaranteed.   -Patient is concerned about taking aggressive treatment in the absence  of obvious identifiable cancer.  She is also concerned with sexual side effects given she is recently married to her husband. -She would like to learn more about radiation and possible long term side effects, we have referred her to rad onc -she is also considering observation alone.  -We are requesting MMR testing on the biopsy to see if she is a candidate for immunotherapy if she has recurrent/metastatic disease in the future -she will call us with her decision after meeting with Dr. Mitzi Hansen and Dr. Maisie Fus again.   HTN -on HCTZ   Substance use -she started Welbutrin to quit smoking, quit 5 years ago, and continues welbutrin. Also on Buspar -she drinks wine nightly  -will review cessation at a later date  Genetics -Due to her family history of pancreatic cancer she is alible for genetic testing  -patient had genetic testing  [facility], [facility] negative.  We will request the report -Patient has 3 healthy daughters, oldest is 57.  Recommendation is for them to begin colorectal cancer screening at age 34 (10 years younger than patient at her diagnosis), or sooner if they have for personal red flags  PLAN: -Work-up reviewed -Patient considering options including surgery vs chemo RT then observation, vs observation alone -Request MMR testing on tissue biopsy -Referral to Dr. Mitzi Hansen in rad onc -Follow-up with Dr. Maisie Fus for surgical discussion -Patient will call to let us know her decision after meeting with her care team -Patient seen with Dr. Mosetta Putt   Orders Placed This Encounter  Procedures   Ambulatory referral to Radiation Oncology    Referral Priority:   Routine    Referral Type:   Consultation    Referral Reason:   Specialty Services Required    Requested Specialty:   Radiation Oncology    Number of Visits Requested:   1    All questions were answered. The patient knows to call the clinic with any problems, questions or concerns.     Pollyann Samples, NP 07/08/2021    Addendum  I have seen the patient, examined her. I agree with the assessment and and plan and have edited the notes.   51 yo female presented with intermittent rectal bleeding for 2 years, and recently underwent colonoscopy and was found to have a large 3 cm pedunculated polyp on a stalk at the distal rectum within 2-3 cm of the dentate line, status post endoscopic resection.  The rectal polyp path showed moderately differentiated adenocarcinoma invading into the submucosa to a depth of 8 mm arising in a tubular adenoma with high-grade dysplasia, case was discussed in GI tumor conference today, this was felt to be SM3 lesion, pelvic MRI and CT scan was negative for residual disease or adenopathy.  Although the resection margin was negative, and no residual disease on image, we discussed the risk of microscopic  disease in nodes is about 20%.  We discussed the option of surgical resection (with the possibility of permanent ostomy), concurrent chemoradiation, versus observation with close monitoring (endoscopy every 3 to 6 months and image every 6 months).  We discussed the option of chemoradiation with oral Xeloda in detail, and the potential side effects from Xeloda and radiation.  Patient does not want surgery, initially was leaning towards observation, but after lengthy discussion, she is open to chemoradiation, and would like to discuss with rad/onc.  Will make an urgent referral. If she agrees with chemRT, plan to start in a few weeks. All questions were answered.  Malachy Mood  07/11/2021

## 2021-07-08 NOTE — Telephone Encounter (Signed)
LVM to sched consult ?

## 2021-07-09 ENCOUNTER — Telehealth: Payer: Self-pay | Admitting: Radiation Oncology

## 2021-07-09 NOTE — Telephone Encounter (Signed)
Spoke with pt who is not sure of spouse's schedule for them to come together. Telephone and mychart consult options were offered but pt seemed unsure. Pt agreed to c/b with decision for consult dates offered.  ?

## 2021-07-10 ENCOUNTER — Ambulatory Visit: Payer: BLUE CROSS/BLUE SHIELD | Admitting: Oncology

## 2021-07-10 NOTE — Progress Notes (Signed)
Kayla Hobbs called stating she wants to move forward with concurrent chemotherapy and radiation therapy.  She has not made her consult appt with Dr Lisbeth Renshaw yet.  I explained she will need to meet with Dr Lisbeth Renshaw as he manages the radiation treatments not Dr Burr Medico.  I explained that Wells Guiles our oral chemotherapy pharmacist will reach out to her regarding copay and which pharmacy we will need to use as well as provide chemotherapy education.  I tyold her I would follow behind the scenes to ensure all appropriate appts are made.  I transferred her call to rad/onc schedulers.  I have notified Dr Burr Medico of her decision.  ?

## 2021-07-11 ENCOUNTER — Other Ambulatory Visit (HOSPITAL_COMMUNITY): Payer: Self-pay

## 2021-07-11 ENCOUNTER — Encounter: Payer: Self-pay | Admitting: Nurse Practitioner

## 2021-07-11 MED ORDER — CAPECITABINE 500 MG PO TABS
825.0000 mg/m2 | ORAL_TABLET | Freq: Two times a day (BID) | ORAL | 1 refills | Status: DC
  Filled 2021-07-11: qty 90, 15d supply, fill #0

## 2021-07-13 ENCOUNTER — Telehealth: Payer: Self-pay | Admitting: Pharmacist

## 2021-07-13 ENCOUNTER — Telehealth: Payer: Self-pay

## 2021-07-13 ENCOUNTER — Other Ambulatory Visit (HOSPITAL_COMMUNITY): Payer: Self-pay

## 2021-07-13 DIAGNOSIS — C20 Malignant neoplasm of rectum: Secondary | ICD-10-CM

## 2021-07-13 MED ORDER — CAPECITABINE 500 MG PO TABS: 825.0000 mg/m2 | ORAL_TABLET | Freq: Two times a day (BID) | ORAL | 1 refills | Status: DC

## 2021-07-13 NOTE — Telephone Encounter (Addendum)
Oral Oncology Pharmacist Encounter ? ?Received new prescription for Xeloda (capecitabine) for the treatment of rectal cancer in conjunction with radiation, planned for duration of radiation therapy. ? ?Patient has CBC from 05/26/21 that's WNL - patient will have f/u labs prior to starting Xeloda. Prescription dose and frequency assessed for appropriateness.  ? ?Current medication list in Epic reviewed, no relevant/significant DDIs with Xeloda identified. ? ?Evaluated chart and no patient barriers to medication adherence noted.  ? ?Patient's insurance requires Xeloda to be filled through [facility]. Prescription redirected for dispensing. ? ?Oral Oncology Clinic will continue to follow for insurance authorization, copayment issues, initial counseling and start date. ? ?Leron Croak, PharmD, BCPS ?Hematology/Oncology Clinical Pharmacist ?[facility] Oral Chemotherapy Navigation Clinics ?[phone] ?07/13/2021 8:39 AM ? ?

## 2021-07-13 NOTE — Progress Notes (Signed)
Per Dr Burr Medico request.  Lab appt made on 07/15/2021 at 1045.  Kayla Hobbs aware of appt. ?

## 2021-07-13 NOTE — Telephone Encounter (Signed)
Oral Oncology Patient Advocate Encounter ?  ?Received notification from [employer] that prior authorization for Xeloda is required. ?  ?PA submitted on CoverMyMeds ?[place] ?Status is pending ?  ?Oral Oncology Clinic will continue to follow. ? ? ?Wynn Maudlin CPHT ?Specialty Pharmacy Patient Advocate ?[place] ?Phone [phone] ?Fax [phone] ?07/13/2021 8:49 AM ? ?

## 2021-07-13 NOTE — Progress Notes (Signed)
Radiation Oncology         [phone] ________________________________  Name: Kayla Hobbs        MRN: [id number]  Date of Service: 07/15/2021 DOB: 1970-07-25  ER:[id number], No Pcp Per (Inactive)  Truitt Merle, MD     REFERRING PHYSICIAN: Truitt Merle, MD   DIAGNOSIS: The encounter diagnosis was Rectal cancer [facility]).   HISTORY OF PRESENT ILLNESS: Kayla Hobbs is a 51 y.o. female seen at the request of Dr. Burr Medico for rectal cancer. The patient initially presented due to symptoms of of bright red blood per rectum.  She was treated for hemorrhoids in 2021 and was encouraged to have colonoscopy but this was not performed.  She returned to schedule a colonoscopy in January 2023.  This was performed on 06/18/2021 and revealed for a diagnosis of rectal cancer.  The procedure revealed a polypoid lesion felt on digital rectal exam, there was additionally a diminutive polyp in the ascending colon that was biopsied, a 5 mm polyp in the sigmoid colon that was sessile and removed with cold snare, and then the large palpable polyp in the distal rectum 2 to 3 cm from the dentate line was noted pedunculated on a short stalk, and the polyp was removed with hot snare.  Small polypoid lesions were also seen in the distal rectum along the dentate line unclear if condyloma versus small adenomas and biopsies were also taken.  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  There were features including lymphovascular space invasion.  She underwent an MRI of the pelvis and CT staging of the chest abdomen pelvis.  Her CT staging was negative for any visible disease, her MRI of the pelvis showed a T2 lesion extending through the muscularis and no visible mesorectal or pelvic sidewall adenopathy.  Her case has  been discussed in multidisciplinary gastrointestinal oncology conference, she is interested in avoiding surgical resection, and has met with Dr. Burr Medico to discuss options of chemoradiation.  She is motivated to consider this modality of therapy which providers are in agreement with and she is seen to discuss this today.   PREVIOUS RADIATION THERAPY: No   PAST MEDICAL HISTORY:  Past Medical History:  Diagnosis Date   Arthritis    Hypertension    Skin cancer    basil top of heasd        PAST SURGICAL HISTORY: Past Surgical History:  Procedure Laterality Date   PARTIAL HYSTERECTOMY  05/2020   due to heavy bleeding   SKIN CANCER EXCISION       FAMILY HISTORY:  Family History  Problem Relation Age of Onset   Heart disease Father    Cancer Brother        liver cancer possibly [place]   Cancer Maternal Grandmother        pancreas   Pancreatic cancer Maternal Grandmother    Diabetes Paternal Grandmother    Heart disease Paternal Grandfather    Cancer Cousin        leukemia   Colon cancer Neg Hx    Stomach cancer Neg Hx    Esophageal cancer Neg Hx      SOCIAL HISTORY:  reports that she has quit smoking. Her smoking use included cigarettes. She has never used smokeless tobacco. She reports current alcohol use. She reports  that she does not use drugs. The patient is married and lives in [place], [place]. She works for the [employer] [place]    ALLERGIES: Patient has no known allergies.   MEDICATIONS:  Current Outpatient Medications  Medication Sig Dispense Refill   buPROPion (WELLBUTRIN SR) 150 MG 12 hr tablet Take 150 mg by mouth 2 (two) times daily.     busPIRone (BUSPAR) 10 MG tablet Take 10 mg by mouth 2 (two) times daily.     capecitabine (XELODA) 500 MG tablet Take 3 tablets (1,500 mg total) by mouth 2 (two) times daily after a meal. On days of radiation, Monday through Friday. 90 tablet 1   estradiol (ESTRACE) 0.5 MG tablet Take 0.5 mg by  mouth daily.     hydrochlorothiazide (HYDRODIURIL) 50 MG tablet Take 50 mg by mouth daily.     SUMAtriptan (IMITREX) 50 MG tablet Take 50 mg by mouth daily as needed. (Patient not taking: Reported on 07/08/2021)     No current facility-administered medications for this encounter.     REVIEW OF SYSTEMS: On review of systems, the patient reports that she is doing okay. She denies any rectal bleeding or drainage. She is not having bladder dysfunction but does have days of constipation and altering days at times of diarrhea. She denies any pelvic pain. She has had a hysterectomy in the past but has been through menopause. 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 No other complaints are verbalized.  PHYSICAL EXAM:  Wt Readings from Last 3 Encounters:  07/15/21 159 lb 12.8 oz (72.5 kg)  07/08/21 161 lb (73 kg)  06/18/21 158 lb (71.7 kg)   Temp Readings from Last 3 Encounters:  07/15/21 97.8 F (36.6 C)  07/08/21 97.8 F (36.6 C) (Temporal)  06/18/21 98 F (36.7 C) (Temporal)   BP Readings from Last 3 Encounters:  07/15/21 (!) 146/94  07/08/21 (!) 121/94  06/18/21 120/79   Pulse Readings from Last 3 Encounters:  07/15/21 80  07/08/21 90  06/18/21 77   Pain Assessment Pain Score: 0-No pain/10  In general this is a well appearing caucasian female in no acute distress. She's alert and oriented x4 and appropriate throughout the examination. Cardiopulmonary assessment is negative for acute distress and she exhibits normal effort.     ECOG = 1  0 - Asymptomatic (Fully active, able to carry on all predisease activities without restriction)  1 - Symptomatic but completely ambulatory (Restricted in physically strenuous activity but ambulatory and able to carry out work of a light or sedentary nature. For example, light housework, office work)  2 - Symptomatic, <50% in bed during the day (Ambulatory and  capable of all self care but unable to carry out any work activities. Up and about more than 50% of waking hours)  3 - Symptomatic, >50% in bed, but not bedbound (Capable of only limited self-care, confined to bed or chair 50% or more of waking hours)  4 - Bedbound (Completely disabled. Cannot carry on any self-care. Totally confined to bed or chair)  5 - Death   Eustace Pen MM, Creech RH, Tormey DC, et al. [phone]). "Toxicity and response criteria of the [facility]". [place] Oncol. 5 (6): 649-55    LABORATORY DATA:  Lab Results  Component Value Date   WBC 5.2 07/15/2021   HGB 14.4 07/15/2021   HCT 40.3  07/15/2021   MCV 89.0 07/15/2021   PLT 210 07/15/2021   No results found for: NA, K, CL, CO2 No results found for: ALT, AST, GGT, ALKPHOS, BILITOT    RADIOGRAPHY: MR PELVIS W WO CONTRAST  Result Date: 07/07/2021 CLINICAL DATA:  Large polypoid lesion removed from the rectum. Lesion reported 2-3 cm from the dentate line. EXAM: MRI PELVIS WITH and  WITHOUT CONTRAST TECHNIQUE: Multiplanar multisequence MR imaging of the pelvis was performed. Without contrast ultrasound gel was administered per rectum to optimize tumor evaluation. COMPARISON:  None. CONTRAST:  7 mL Gadavist FINDINGS: TUMOR LOCATION No discrete lesion identified following polypectomy. Polypectomy within 2-3 cm the dentate line by report. Tumor distance from Anal Verge/Skin Surface: Lesion not identified Tumor distance to Internal Anal Sphincter: Lesion not identified TUMOR DESCRIPTION Circumferential Extent: Lesion knots identified Tumor Length: Lesion not identified cm T - CATEGORY Extension through Muscularis Propria: No equal T2 Shortest Distance of any tumor/node from Mesorectal Fascia: Lesion not identified mm Extramural Vascular Invasion/Tumor Thrombus: No Invasion of Anterior Peritoneal Reflection: No Involvement of Adjacent Organs or Pelvic Sidewall: No Levator Ani Involvement: No N - CATEGORY  Mesorectal Lymph Nodes >=73m: None equal in 0 Extra-mesorectal Lymphadenopathy: No Other: Small 10 mm cyst in the LEFT adnexal region. Post hysterectomy IMPRESSION: Rectal adenocarcinoma T stage: T2.  Lesion not identified Rectal adenocarcinoma N stage:  N0 Distance from tumor to the internal anal sphincter. Lesion not identified Electronically Signed   By: SSuzy BouchardM.D.   On: 07/07/2021 08:46   CT CHEST ABDOMEN PELVIS W CONTRAST  Result Date: 07/03/2021 CLINICAL DATA:  Newly diagnosed rectal carcinoma.  Staging. EXAM: CT CHEST, ABDOMEN, AND PELVIS WITH CONTRAST TECHNIQUE: Multidetector CT imaging of the chest, abdomen and pelvis was performed following the standard protocol during bolus administration of intravenous contrast. RADIATION DOSE REDUCTION: This exam was performed according to the departmental dose-optimization program which includes automated exposure control, adjustment of the mA and/or kV according to patient size and/or use of iterative reconstruction technique. CONTRAST:  1074mOMNIPAQUE IOHEXOL 300 MG/ML  SOLN COMPARISON:  None. FINDINGS: CT CHEST FINDINGS Cardiovascular: No acute findings. Mediastinum/Lymph Nodes: No masses or pathologically enlarged lymph nodes identified. Lungs/Pleura: No suspicious pulmonary nodules or masses identified. No evidence of infiltrate or pleural effusion. Musculoskeletal:  No suspicious bone lesions identified. CT ABDOMEN AND PELVIS FINDINGS Hepatobiliary: Probable tiny sub-cm cyst noted in posterior right hepatic lobe. No other liver lesions identified. Gallbladder is unremarkable. No evidence of biliary ductal dilatation. Pancreas:  No mass or inflammatory changes. Spleen:  Within normal limits in size and appearance. Adrenals/Urinary tract:  No masses or hydronephrosis. Stomach/Bowel: No focal soft tissue masses identified. No evidence of obstruction, inflammatory process, or abnormal fluid collections. Normal appendix visualized. Vascular/Lymphatic:  No pathologically enlarged lymph nodes identified. No acute vascular findings. Reproductive: Prior hysterectomy noted. Adnexal regions are unremarkable in appearance. Other:  None. Musculoskeletal:  No suspicious bone lesions identified. IMPRESSION: Negative. No evidence of metastatic disease or other significant abnormality. Electronically Signed   By: JoMarlaine Hind.D.   On: 07/03/2021 14:28       IMPRESSION/PLAN: 1. Stage I, cT2N0M0 adenocarcinoma of the distal rectum. Dr. MoLisbeth Renshawiscusses the pathology findings and reviews the nature of local rectal disease. Her case has been discussed in multidisciplinary GI oncology conference and her tumor is considered malignant, and while surgery is the gold standard, she is not interested in losing bowel function and having an end colostomy. Rather we discussed the alternative with  chemoradiation followed by aggressive surveillance.  We discussed the risks, benefits, short, and long term effects of radiotherapy, as well as the curative intent, and the patient is interested in proceeding. Dr. Lisbeth Renshaw discusses the delivery and logistics of radiotherapy and anticipates a course of 5 1/2 weeks of radiotherapy to the pelvis. She will be contacted to coordinate treatment planning by our simulation department. Written consent is obtained and placed in the chart, a copy was provided to the patient. We anticipate treatment will start on 07/27/21. 2. Financial concerns related to loss of work and concerns of being able to do her job. [name] role certainly has limitations in access to a restroom which is the greatest concern during radiation. We will submit her paperwork through our HIM nurse Barnet Glasgow, RN and also request social work referral to help navigate options of disability.  3. 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 She will follow along with Dr. Marcello Moores for this as well.   In a visit lasting 60 minutes, greater than 50% of the time was spent face to face discussing the patient's condition, in preparation for the discussion, and coordinating the patient's care.   The above documentation reflects my direct findings during this shared patient visit. Please see the separate note by Dr. Lisbeth Renshaw on this date for the remainder of the patient's plan of care.    Carola Rhine, [facility]   **Disclaimer: This note was dictated with voice recognition software. Similar sounding words can inadvertently be transcribed and this note may contain transcription errors which may not have been corrected upon publication of note.**

## 2021-07-13 NOTE — Progress Notes (Signed)
GI Location of Tumor / Histology: Rectal Cancer ? ?Kayla Hobbs initially presented in March 2021 with rectal bleeding, colonoscopy was canceled by the patient.  She had persistent bleeding and presented back to GI 06/2021, work up showed distal rectal polyp palpable on digital rectal exam. ? ?MRI Pelvis 07/03/2021: ?Rectal adenocarcinoma T stage: T2.  Lesion not identified ?  ?Rectal adenocarcinoma N stage:  N0 ?  ?Distance from tumor to the internal anal sphincter. Lesion not ?identified ? ? ?CT CAP 07/02/2021: Negative.  No evidence of metastatic disease or other significant abnormality. ? ?Colonoscopy 06/18/2021: Polypoid lesion found on digital rectal exam.  One diminutive polyp in the ascending colon, removed with a cold biopsy forceps. Resected and retrieved.  One 5 mm polyp in the sigmoid colon, removed with a cold snare. Resected and retrieved.  One large polyp in the distal rectum. Resected and retrieved as outlined. Injected/tattooed.  Clips were placed.  Polypoid lesions in the distal rectum. Biopsied.  Internal hemorrhoids.  The examination was otherwise normal. ? ? ?Biopsies of Rectal Mass 06/18/2021 ? ? ?Past/Anticipated interventions by surgeon, if any:  ?Dr. 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 Given that she will undergo RT, we will re-examine after treatment to evaluate for regression. ?-Patient had a pedunculated lesion removed from the distal rectum. -Pathology is favorable except for a short margin during resection and depth of invasion. Imaging has been negative.  ?-We discussed this in GI tumor board and the recommendation was made to proceed with chemoRT as definitive treatment and then placed the patient on watch and wait surveillance.  ?-She will need routine imaging and exams afterwards.  ? ? ?Past/Anticipated interventions by medical oncology, if any:  ?Cira Rue / Dr. Burr Medico 07/08/2021 ?-Her case was discussed in our GI conference  today 07/08/2021, the options include either upfront surgery vs chemo RT and observation, vs observation alone which would include flex sigmoidoscopy plus CT/MRI every 3-6 months for the first 3 years out.  ?-Dr. Marcello Moores reviewed this would be a complex surgery with the need for at least temporary, possibly permanent, colostomy.  Patient would like to avoid surgery if possible ?-We reviewed logistics of chemoradiation with Xeloda given twice daily on M-F days with radiation. ?-Patient is concerned about taking aggressive treatment in the absence of obvious identifiable cancer.  She is also concerned with sexual side effects given she is recently married to her husband. ?-She would like to learn more about radiation and possible long term side effects, we have referred her to rad onc ?-she is also considering observation alone.  ?-We are requesting MMR testing on the biopsy to see if she is a candidate for immunotherapy if she has recurrent/metastatic disease in the future ?-she will call us with her decision after meeting with Dr. Lisbeth Renshaw and Dr. Marcello Moores again. ? ? ? ?Weight changes, if any: No ? ?Bowel/Bladder complaints, if any: Fluctuates from loose to normal bowels.  Denies bladder changes. ? ?Nausea / Vomiting, if any: No ? ?Pain issues, if any: No  ? ?Any blood per rectum: No   ? ?SAFETY ISSUES: ?Prior radiation? No ?Pacemaker/ICD? No ?Possible current pregnancy? Hysterectomy ?Is the patient on methotrexate? No  ? ?Current Complaints/Details: ? ?

## 2021-07-13 NOTE — Telephone Encounter (Signed)
Oral Oncology Patient Advocate Encounter ? ?Prior Authorization for Xeloda has been approved.   ? ?PA# [id number] ?Effective dates: 06/13/21 through 07/13/22 ? ?Patient must use [facility] ? ?Oral Oncology Clinic will continue to follow.  ? ?Wynn Maudlin CPHT ?Specialty Pharmacy Patient Advocate ?[place] ?Phone [phone] ?Fax [phone] ?07/13/2021 8:50 AM ? ?

## 2021-07-13 NOTE — Telephone Encounter (Signed)
Oral Oncology Patient Advocate Encounter ?  ?Received notification from [place] that prior authorization for Xeloda is required. ?  ?PA submitted on CoverMyMeds ?[place] [id number] ?Status is pending ?  ?Oral Oncology Clinic will continue to follow. ? ? ?Wynn Maudlin CPHT ?Specialty Pharmacy Patient Advocate ?[place] ?Phone [phone] ?Fax [phone] ?07/13/2021 8:52 AM ? ?

## 2021-07-14 ENCOUNTER — Encounter: Payer: Self-pay | Admitting: Nurse Practitioner

## 2021-07-14 ENCOUNTER — Other Ambulatory Visit: Payer: Self-pay

## 2021-07-14 NOTE — Telephone Encounter (Signed)
Oral Oncology Patient Advocate Encounter ? ?Prior Authorization for Xeloda has been approved.   ? ?PA# [id number] ?Effective dates: 07/13/21 through 07/14/22 ? ?Patient must use [facility] ? ?Oral Oncology Clinic will continue to follow.  ? ?Wynn Maudlin CPHT ?Specialty Pharmacy Patient Advocate ?[place] ?Phone [phone] ?Fax [phone] ?07/14/2021 8:46 AM ? ?

## 2021-07-15 ENCOUNTER — Ambulatory Visit
Admission: RE | Admit: 2021-07-15 | Discharge: 2021-07-15 | Disposition: A | Discharge status: Home / Self Care | Payer: BLUE CROSS/BLUE SHIELD | Source: Ambulatory Visit | Attending: Radiation Oncology | Admitting: Radiation Oncology

## 2021-07-15 ENCOUNTER — Inpatient Hospital Stay: Payer: BLUE CROSS/BLUE SHIELD

## 2021-07-15 ENCOUNTER — Encounter: Payer: Self-pay | Admitting: Radiation Oncology

## 2021-07-15 ENCOUNTER — Telehealth: Payer: Self-pay | Admitting: *Deleted

## 2021-07-15 ENCOUNTER — Other Ambulatory Visit: Payer: Self-pay

## 2021-07-15 ENCOUNTER — Other Ambulatory Visit (HOSPITAL_COMMUNITY): Payer: Self-pay

## 2021-07-15 VITALS — BP 146/94 | HR 80 | Temp 97.8°F | Resp 20 | Ht 64.0 in | Wt 159.8 lb

## 2021-07-15 DIAGNOSIS — Z85828 Personal history of other malignant neoplasm of skin: Secondary | ICD-10-CM | POA: Diagnosis not present

## 2021-07-15 DIAGNOSIS — I10 Essential (primary) hypertension: Secondary | ICD-10-CM | POA: Insufficient documentation

## 2021-07-15 DIAGNOSIS — Z800 Family history of malignant neoplasm of digestive organs: Secondary | ICD-10-CM | POA: Diagnosis not present

## 2021-07-15 DIAGNOSIS — C20 Malignant neoplasm of rectum: Secondary | ICD-10-CM | POA: Diagnosis present

## 2021-07-15 DIAGNOSIS — Z87891 Personal history of nicotine dependence: Secondary | ICD-10-CM | POA: Insufficient documentation

## 2021-07-15 DIAGNOSIS — Z79899 Other long term (current) drug therapy: Secondary | ICD-10-CM | POA: Diagnosis not present

## 2021-07-15 DIAGNOSIS — M129 Arthropathy, unspecified: Secondary | ICD-10-CM | POA: Insufficient documentation

## 2021-07-15 DIAGNOSIS — A630 Anogenital (venereal) warts: Secondary | ICD-10-CM | POA: Diagnosis not present

## 2021-07-15 LAB — CMP (CANCER CENTER ONLY)
ALT: 17 U/L (ref 0–44)
AST: 20 U/L (ref 15–41)
Albumin: 4.2 g/dL (ref 3.5–5.0)
Alkaline Phosphatase: 85 U/L (ref 38–126)
Anion gap: 7 (ref 5–15)
BUN: 13 mg/dL (ref 6–20)
CO2: 35 mmol/L — ABNORMAL HIGH (ref 22–32)
Calcium: 10.1 mg/dL (ref 8.9–10.3)
Chloride: 99 mmol/L (ref 98–111)
Creatinine: 0.96 mg/dL (ref 0.44–1.00)
GFR, Estimated: >60 mL/min (ref >60)
Glucose, Bld: 93 mg/dL (ref 70–99)
Potassium: 3 mmol/L — ABNORMAL LOW (ref 3.5–5.1)
Sodium: 141 mmol/L (ref 135–145)
Total Bilirubin: 0.7 mg/dL (ref 0.3–1.2)
Total Protein: 6.9 g/dL (ref 6.5–8.1)

## 2021-07-15 LAB — CBC WITH DIFFERENTIAL (CANCER CENTER ONLY)
Abs Immature Granulocytes: 0.01 10*3/uL (ref 0.00–0.07)
Basophils Absolute: 0 10*3/uL (ref 0.0–0.1)
Basophils Relative: 1 %
Eosinophils Absolute: 0.1 10*3/uL (ref 0.0–0.5)
Eosinophils Relative: 2 %
HCT: 40.3 % (ref 36.0–46.0)
Hemoglobin: 14.4 g/dL (ref 12.0–15.0)
Immature Granulocytes: 0 %
Lymphocytes Relative: 41 %
Lymphs Abs: 2.1 10*3/uL (ref 0.7–4.0)
MCH: 31.8 pg (ref 26.0–34.0)
MCHC: 35.7 g/dL (ref 30.0–36.0)
MCV: 89 fL (ref 80.0–100.0)
Monocytes Absolute: 0.6 10*3/uL (ref 0.1–1.0)
Monocytes Relative: 12 %
Neutro Abs: 2.2 10*3/uL (ref 1.7–7.7)
Neutrophils Relative %: 44 %
Platelet Count: 210 10*3/uL (ref 150–400)
RBC: 4.53 MIL/uL (ref 3.87–5.11)
RDW: 13 % (ref 11.5–15.5)
WBC Count: 5.2 10*3/uL (ref 4.0–10.5)
nRBC: 0 % (ref 0.0–0.2)

## 2021-07-15 NOTE — Telephone Encounter (Signed)
Connected with Toriana Sponsel [phone] (home) regarding FMLA paperwork received for spouse Carmelina Paddock addressed to navigator.     ? ?This forms nurse advised [name] of need for spouse to inform Bebe Liter to submit new FMLA form addressed correctly to legal name "Peters" not an individual. ?Per H.I.P.A.A. Guidelines, electronic, oral or paper release of health information (PHI) to a third party requires patient sign the "[place] for Use/Disclosure" form addressed to specific named party requesting use of patient PHI.    ?Prepared ROI form currently left with main office entry receptionist desk.  Awaiting Ariza Evans return to [facility] to sign. ?Currently no further questions or needs.    ?

## 2021-07-16 ENCOUNTER — Other Ambulatory Visit: Payer: Self-pay | Admitting: Hematology

## 2021-07-16 ENCOUNTER — Encounter: Payer: Self-pay | Admitting: Licensed Clinical Social Worker

## 2021-07-16 MED ORDER — POTASSIUM CHLORIDE CRYS ER 20 MEQ PO TBCR: 20.0000 meq | EXTENDED_RELEASE_TABLET | Freq: Two times a day (BID) | ORAL | 1 refills | Status: DC

## 2021-07-16 NOTE — Progress Notes (Signed)
[place] Clinical Social Work  ?Initial Assessment ? ? ?Kayla Hobbs is a 51 y.o. year old female contacted by phone. Clinical Social Work was referred by nurse navigator for assessment of psychosocial needs.  ? ?SDOH (Social Determinants of Health) assessments performed: Yes ?  ?Distress Screen completed: No ?ONCBCN DISTRESS SCREENING 07/15/2021  ?Screening Type Initial Screening  ?Distress experienced in past week (1-10) 8  ?Emotional problem type Nervousness/Anxiety;Adjusting to illness  ? ? ? ? ?Family/Social Information:  ?Housing Arrangement: patient lives with spouse. ?Family members/support persons in your life? 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 Income source: Employment ?Financial concerns: Yes, due to illness and/or loss of work during treatment ?Type of concern:  Pt states she got married a year ago and her husband takes care of the mortgage and house bills; however, pt is financially responsible for a personal loan which she will be unable to pay if she is not working through treatment. ?Food access concerns: no ?Religious or spiritual practice: none identified ?Services Currently in place:  none ? ?Coping/ Adjustment to diagnosis: ?Patient understands treatment plan and what happens next? yes ?Concerns about diagnosis and/or treatment: Relationship with husband or partner ?Patient reported stressors: Finances ?Hopes and priorities: Pt's priority is to complete radiation treatment w/ the hope of eliminating the cancer and moving forward. ?Patient enjoys time with family/ friends ?Current coping skills/ strengths: Supportive family/friends  ? ? ? SUMMARY: ?Current SDOH Barriers:  ?Financial constraints related to loss of work during treatment. ? ?Clinical Social Work Clinical Goal(s):  ?patient will work with SW to address concerns related to  financial concerns. ? ?Interventions: ?Discussed common feeling and emotions when being diagnosed with cancer, and the importance of support during treatment ?Informed patient of the support team roles and support services at [facility] ?Provided CSW contact information and encouraged patient to call with any questions or concerns ?Referred patient to financial resources to determine eligibility for grant assistance.   ? ? ?Follow Up Plan: CSW will follow-up with patient by phone to provide emotional support as appropriate and continually assess for needs throughout duration of treatment. ?Patient verbalizes understanding of plan: Yes ? ? ? ?Henriette Combs, LCSW ?

## 2021-07-17 ENCOUNTER — Ambulatory Visit
Admission: RE | Admit: 2021-07-17 | Discharge: 2021-07-17 | Disposition: A | Discharge status: Home / Self Care | Payer: BLUE CROSS/BLUE SHIELD | Source: Ambulatory Visit | Attending: Radiation Oncology | Admitting: Radiation Oncology

## 2021-07-17 ENCOUNTER — Other Ambulatory Visit: Payer: Self-pay

## 2021-07-17 DIAGNOSIS — C20 Malignant neoplasm of rectum: Secondary | ICD-10-CM | POA: Insufficient documentation

## 2021-07-17 DIAGNOSIS — Z510 Encounter for antineoplastic radiation therapy: Secondary | ICD-10-CM | POA: Insufficient documentation

## 2021-07-17 NOTE — Telephone Encounter (Signed)
Oral Chemotherapy Pharmacist Encounter ? ?I spoke with patient for overview of: Xeloda for the treatment of rectal cancer in conjunction with radiation, planned duration 5 1/2 weeks.  ? ?Counseled patient on administration, dosing, side effects, monitoring, drug-food interactions, safe handling, storage, and disposal. ? ?Patient will take Xeloda '500mg'$  tablets, 3 tablets ('1500mg'$ ) by mouth in AM and 3 tabs ('1500mg'$ ) by mouth in PM, within 30 minutes of finishing meals, on days of radiation only. ? ?Xeloda and radiation start date: 07/27/21 ? ?Adverse effects of Xeloda include but are not limited to: fatigue, decreased blood counts, GI upset, diarrhea, mouth sores, and hand-foot syndrome. ? ?Patient will obtain anti diarrheal and alert the office of 4 or more loose stools above baseline.  ? ?Reviewed with patient importance of keeping a medication schedule and plan for any missed doses. No barriers to medication adherence identified. ? ?Medication reconciliation performed and medication/allergy list updated. ? ?Insurance authorization for Xeloda has been obtained. Patient's insurance requires Xeloda to be filled through [facility]. This was shipped from their pharmacy on 07/17/21. ? ?All questions answered. ? ?Ms. Rought voiced understanding and appreciation.  ? ?Medication education handout and and medication calendar placed at front desk of [facility] for patient to pick up prior to rad/onc appt on 07/17/21. Patient knows to call the office with questions or concerns. Oral Chemotherapy Clinic phone number provided to patient.  ? ?Leron Croak, PharmD, BCPS ?Hematology/Oncology Clinical Pharmacist ?[facility] Oral Chemotherapy Navigation Clinics ?[phone] ?07/17/2021 11:32 AM ? ?

## 2021-07-17 NOTE — Progress Notes (Signed)
Per Dr Ernestina Penna request I spoke with Ms Occhipinti regarding her low potassium level.  I let her know that a prescription for potassium was sent to her pharmacy.  She is to take 1 tablet daily not 2 tablets daily as on the prescription.  She will have labs drawn on 3/20 prior to her radiation treatment.  She did not have any questions regarding treatment at this time.  All questions were answered.  She verbalized understanding. ? ?

## 2021-07-20 ENCOUNTER — Telehealth: Payer: Self-pay | Admitting: Hematology

## 2021-07-20 NOTE — Telephone Encounter (Signed)
.  Called patient to schedule appointment per 3/10 inbasket, patient is aware of date and time.   ? ?

## 2021-07-21 ENCOUNTER — Telehealth: Payer: BLUE CROSS/BLUE SHIELD | Admitting: Physician Assistant

## 2021-07-21 ENCOUNTER — Encounter: Payer: Self-pay | Admitting: Nurse Practitioner

## 2021-07-21 DIAGNOSIS — J069 Acute upper respiratory infection, unspecified: Secondary | ICD-10-CM

## 2021-07-21 DIAGNOSIS — H6691 Otitis media, unspecified, right ear: Secondary | ICD-10-CM | POA: Diagnosis not present

## 2021-07-21 MED ORDER — AMOXICILLIN 500 MG PO CAPS: 500.0000 mg | ORAL_CAPSULE | Freq: Three times a day (TID) | ORAL | 0 refills | Status: AC

## 2021-07-21 MED ORDER — FLUTICASONE PROPIONATE 50 MCG/ACT NA SUSP: 2.0000 | Freq: Every day | NASAL | 0 refills | Status: AC

## 2021-07-21 NOTE — Progress Notes (Signed)
?Virtual Visit Consent  ? ?Kayla Hobbs, you are scheduled for a virtual visit with a [facility] provider today.   ?  ?Just as with appointments in the office, your consent must be obtained to participate.  Your consent will be active for this visit and any virtual visit you may have with one of our providers in the next 365 days.   ?  ?If you have a MyChart account, a copy of this consent can be sent to you electronically.  All virtual visits are billed to your insurance company just like a traditional visit in the office.   ? ?As this is a virtual visit, video technology does not allow for your provider to perform a traditional examination.  This may limit your provider's ability to fully assess your condition.  If your provider identifies any concerns that need to be evaluated in person or the need to arrange testing (such as labs, EKG, etc.), we will make arrangements to do so.   ?  ?Although advances in technology are sophisticated, we cannot ensure that it will always work on either your end or our end.  If the connection with a video visit is poor, the visit may have to be switched to a telephone visit.  With either a video or telephone visit, we are not always able to ensure that we have a secure connection.    ? ?I need to obtain your verbal consent now.   Are you willing to proceed with your visit today?  ?  ?Kayla Hobbs has provided verbal consent on 07/21/2021 for a virtual visit (video or telephone). ?  ?Leeanne Rio, PA-C  ? ?Date: 07/21/2021 10:03 AM ? ? ?Virtual Visit via Video Note  ? ?ILeeanne Rio, connected with  Kayla Hobbs  ([id number], 1971-01-04) on 07/21/21 at 10:00 AM EDT by a video-enabled telemedicine application and verified that I am speaking with the correct person using two identifiers. ? ?Location: ?Patient: Virtual Visit Location Patient: Home ?Provider: Virtual Visit Location Provider: Home Office ?  ?I discussed the limitations of evaluation and management  by telemedicine and the availability of in person appointments. The patient expressed understanding and agreed to proceed.   ? ?History of Present Illness: ?Kayla Hobbs is a 51 y.o. who identifies as a female who was assigned female at birth, and is being seen today for several days of nasal congestion, rhinorrhea and sneezing. Started Allegra which has helped but still dealing with sore throat. Now with substantial ear pain of R ear. Denies tinnitus, dizziness or hearing change. Notes R sided cervical adenopathy. Denies recent travel or sick contact. Is starting chemotherapy next week for rectal cancer and worried about not being able to start.  ? ?HPI: HPI  ?Problems:  ?Patient Active Problem List  ? Diagnosis Date Noted  ? Rectal cancer ([place]) 07/08/2021  ? Colon cancer screening 05/26/2021  ? Hematochezia 07/26/2019  ? Paresthesia 02/23/2019  ?  ?Allergies: No Known Allergies ?Medications:  ?Current Outpatient Medications:  ?  amoxicillin (AMOXIL) 500 MG capsule, Take 1 capsule (500 mg total) by mouth 3 (three) times daily for 10 days., Disp: 30 capsule, Rfl: 0 ?  fluticasone (FLONASE) 50 MCG/ACT nasal spray, Place 2 sprays into both nostrils daily., Disp: 16 g, Rfl: 0 ?  buPROPion (WELLBUTRIN SR) 150 MG 12 hr tablet, Take 150 mg by mouth 2 (two) times daily., Disp: , Rfl:  ?  busPIRone (BUSPAR) 10 MG tablet, Take 10 mg by mouth 2 (two)  times daily., Disp: , Rfl:  ?  capecitabine (XELODA) 500 MG tablet, Take 3 tablets (1,500 mg total) by mouth 2 (two) times daily after a meal. On days of radiation, Monday through Friday., Disp: 90 tablet, Rfl: 1 ?  estradiol (ESTRACE) 0.5 MG tablet, Take 0.5 mg by mouth daily., Disp: , Rfl:  ?  hydrochlorothiazide (HYDRODIURIL) 50 MG tablet, Take 50 mg by mouth daily., Disp: , Rfl:  ?  potassium chloride SA (KLOR-CON M) 20 MEQ tablet, Take 1 tablet (20 mEq total) by mouth 2 (two) times daily., Disp: 30 tablet, Rfl: 1 ?  SUMAtriptan (IMITREX) 50 MG tablet, Take 50 mg by mouth  daily as needed. (Patient not taking: Reported on 07/08/2021), Disp: , Rfl:  ? ?Observations/Objective: ?Patient is well-developed, well-nourished in no acute distress.  ?Resting comfortably at home.  ?Head is normocephalic, atraumatic.  ?No labored breathing. ?Speech is clear and coherent with logical content.  ?Patient is alert and oriented at baseline.  ? ?Assessment and Plan: ?1. Viral URI ?- fluticasone (FLONASE) 50 MCG/ACT nasal spray; Place 2 sprays into both nostrils daily.  Dispense: 16 g; Refill: 0 ? ?2. Right otitis media, unspecified otitis media type ?- amoxicillin (AMOXIL) 500 MG capsule; Take 1 capsule (500 mg total) by mouth 3 (three) times daily for 10 days.  Dispense: 30 capsule; Refill: 0 ? ?Suspected mild viral URI and allergic inflammation causing development of secondary AOM. Recommend COVID testing just due to upcoming procedure. Supportive measures, OTC medications reviewed. Continue allergy medication. Rx Flonase and Amoxicillin. Strict follow-up precautions discussed.  ? ?Follow Up Instructions: ?I discussed the assessment and treatment plan with the patient. The patient was provided an opportunity to ask questions and all were answered. The patient agreed with the plan and demonstrated an understanding of the instructions.  A copy of instructions were sent to the patient via MyChart unless otherwise noted below.  ? ?The patient was advised to call back or seek an in-person evaluation if the symptoms worsen or if the condition fails to improve as anticipated. ? ?Time:  ?I spent 10 minutes with the patient via telehealth technology discussing the above problems/concerns.   ? ?Leeanne Rio, PA-C ?

## 2021-07-21 NOTE — Patient Instructions (Signed)
?  [name], thank you for joining Leeanne Rio, PA-C for today's virtual visit.  While this provider is not your primary care provider (PCP), if your PCP is located in our provider database this encounter information will be shared with them immediately following your visit. ? ?Consent: ?(Patient) Andria Head provided verbal consent for this virtual visit at the beginning of the encounter. ? ?Current Medications: ? ?Current Outpatient Medications:  ?  buPROPion (WELLBUTRIN SR) 150 MG 12 hr tablet, Take 150 mg by mouth 2 (two) times daily., Disp: , Rfl:  ?  busPIRone (BUSPAR) 10 MG tablet, Take 10 mg by mouth 2 (two) times daily., Disp: , Rfl:  ?  capecitabine (XELODA) 500 MG tablet, Take 3 tablets (1,500 mg total) by mouth 2 (two) times daily after a meal. On days of radiation, Monday through Friday., Disp: 90 tablet, Rfl: 1 ?  estradiol (ESTRACE) 0.5 MG tablet, Take 0.5 mg by mouth daily., Disp: , Rfl:  ?  hydrochlorothiazide (HYDRODIURIL) 50 MG tablet, Take 50 mg by mouth daily., Disp: , Rfl:  ?  potassium chloride SA (KLOR-CON M) 20 MEQ tablet, Take 1 tablet (20 mEq total) by mouth 2 (two) times daily., Disp: 30 tablet, Rfl: 1 ?  SUMAtriptan (IMITREX) 50 MG tablet, Take 50 mg by mouth daily as needed. (Patient not taking: Reported on 07/08/2021), Disp: , Rfl:   ? ?Medications ordered in this encounter:  ?No orders of the defined types were placed in this encounter. ?  ? ?*If you need refills on other medications prior to your next appointment, please contact your pharmacy* ? ?Follow-Up: ?Call back or seek an in-person evaluation if the symptoms worsen or if the condition fails to improve as anticipated. ? ?Other Instructions ?Please keep well-hydrated and get plenty of rest.  ?Continue your allergy medication. ?Start a saline nasal rinse. ?Use the Flonase and take the antibiotic as directed. ? ?Feel better soon! ? ? ?If you have been instructed to have an in-person evaluation today at a local  Urgent Care facility, please use the link below. It will take you to a list of all of our available [facility], including address, phone number and hours of operation. Please do not delay care.  ?[facility] ? ?If you or a family member do not have a primary care provider, use the link below to schedule a visit and establish care. When you choose a [facility] primary care physician or advanced practice provider, you gain a long-term partner in health. ?Find a Primary Care Provider ? ?Learn more about [facility]'s in-office and virtual care options: ?[facility]  ?

## 2021-07-23 ENCOUNTER — Telehealth: Payer: Self-pay | Admitting: *Deleted

## 2021-07-23 ENCOUNTER — Encounter: Payer: Self-pay | Admitting: Hematology

## 2021-07-23 ENCOUNTER — Other Ambulatory Visit: Payer: Self-pay

## 2021-07-23 ENCOUNTER — Inpatient Hospital Stay (HOSPITAL_BASED_OUTPATIENT_CLINIC_OR_DEPARTMENT_OTHER): Payer: BLUE CROSS/BLUE SHIELD | Admitting: Hematology

## 2021-07-23 DIAGNOSIS — C20 Malignant neoplasm of rectum: Secondary | ICD-10-CM | POA: Diagnosis not present

## 2021-07-23 DIAGNOSIS — Z510 Encounter for antineoplastic radiation therapy: Secondary | ICD-10-CM | POA: Diagnosis not present

## 2021-07-23 MED ORDER — ONDANSETRON HCL 8 MG PO TABS
8.0000 mg | ORAL_TABLET | Freq: Three times a day (TID) | ORAL | 0 refills | Status: DC | PRN
Start: 2021-07-23 — End: 2021-10-13

## 2021-07-23 NOTE — Progress Notes (Signed)
?[facility]   ?[phone] [phone]   ?Clinic Follow up Note  ? ?Patient Care Team: ?Patient, No Pcp Per (Inactive) as PCP - General (General Practice) ?Leighton Ruff, MD as Consulting Physician (General Surgery) ?Truitt Merle, MD as Consulting Physician (Hematology) ?Kayla Hobbs, Kayla Goody, RN as Sales executive (Oncology) ?Kayla Feeling, NP as Nurse Practitioner (Nurse Practitioner) ? ?Date of Service:  07/23/2021 ? ?I connected with Kayla Hobbs on 07/23/2021 at  4:00 PM EDT by telephone visit and verified that I am speaking with the correct person using two identifiers.  ?I discussed the limitations, risks, security and privacy concerns of performing an evaluation and management service by telephone and the availability of in person appointments. I also discussed with the patient that there may be a patient responsible charge related to this service. The patient expressed understanding and agreed to proceed.  ? ?Other persons participating in the visit and their role in the encounter:  none ? ?Patient's location:  home ?Provider's location:  my office ? ?CHIEF COMPLAINT: f/u of rectal cancer ? ?CURRENT THERAPY:  ?To start Concurrent chemoRT with Xeloda on 07/27/21 ?-Xeloda dose: 1500 mg BID M-F ? ?ASSESSMENT & PLAN:  ?Kayla Hobbs is a 51 y.o. female with  ? ?1. Moderately differentiated rectal adenocarcinoma, G2, cT2N0M0 stage I ?-presented with rectal bleeding in 07/2019, but patient cancelled colonoscopy at that time. She had persistent bleeding and presented back to GI 06/2021, work up showed a distal rectal polyp palpable on DRE. Colonoscopy on 06/18/21 by Dr. Havery [name] showed a ~3 cm polyp, path revealed moderately differentiated adenocarcinoma invading submucosa to a dept of 8 mm, with a close (0.1 cm) but negative margin.   ?-Staging CT CAP on 07/02/21 was negative for local regional adenopathy or distant metastasis. ?-Staging pelvic MRI on 07/03/21 showed local  inflammation but no identifiable lesion. ?-MMR testing on biopsy was normal. ?-she met with Dr. Lisbeth Renshaw in rad onc on 07/15/21 and agrees to proceed with chemoRT. She is scheduled to begin treatment on 07/27/21. She notes she has already received the Xeloda. We reviewed that she will start the same day as radiation and take only on treatment days. We reviewed side effects again today, especially fatigue, mild nausea, diarrhea, skin toxicity especially in palms and bottom of feet, abnormal liver functions, cytopenias, and risk of infections.  We discussed management of side effects.  She voiced good understanding and agrees to proceed next week. ?  ?2. HTN ?-on HCTZ  ?  ?3. Substance use ?-she started Welbutrin to quit smoking, managed to quit 5 years ago. She continues welbutrin, as well as Buspar ?-she drinks wine nightly  ?-will review cessation at a later date ?  ?4. Genetics ?-patient had genetic [facility], reportedly negative.  We will request the report ?-Patient has 3 healthy daughters, oldest is 79.  Recommendation is for them to begin colorectal cancer screening at age 70 (14 years younger than patient at her diagnosis), or sooner if they have for personal red flags ?  ? ?PLAN: ?-lab and radiation on 07/27/21 ?-start Xeloda on 07/27/21 and continue on radiation treatment days ?-I called in Zofran ?-lab and f/u on 08/06/21 as scheduled ? ? ?No problem-specific Assessment & Plan notes found for this encounter. ? ? ?SUMMARY OF ONCOLOGIC HISTORY: ?Oncology History  ?Rectal cancer ([place])  ?06/18/2021 Cancer Staging  ? Staging form: Colon and Rectum, AJCC 8th Edition ?- Clinical stage from 06/18/2021: Stage I (  cT1, cN0, cM0) - Signed by Truitt Merle, MD on 07/11/2021 ?Stage prefix: Initial diagnosis ?Total positive nodes: 0 ?Histologic grade (G): G2 ?Histologic grading system: 4 grade system ? ?  ?06/18/2021 Procedure  ? Colonoscopy per Dr. Havery [name], impression ?- Polypoid lesion found on  digital rectal exam. ?- One diminutive polyp in the ascending colon, removed with a cold biopsy forceps. Resected and retrieved. ?- One 5 mm polyp in the sigmoid colon, removed with a cold snare. Resected and retrieved. ?- One large polyp in the distal rectum. Resected and retrieved as outlined. Injected/tattooed. ?Clips were placed. ?- Polypoid lesions in the distal rectum. Biopsied. ?- Internal hemorrhoids. ?- The examination was otherwise normal. ?  ?06/18/2021 Initial Biopsy  ? Diagnosis ?1. Surgical [P], colon, distal rectal biopsy ?- CONDYLOMA ACUMINATA ?2. Surgical [P], colon, ascending, sigmoid, polyp (2) ?- TUBULAR ADENOMA (2 OF 2 FRAGMENTS) ?- NO HIGH-GRADE DYSPLASIA OR MALIGNANCY IDENTIFIED ?3. Surgical [P], colon, rectum, polyp (1) ?- ADENOCARCINOMA ARISING IN A TUBULAR ADENOMA WITH HIGH-GRADE DYSPLASIA ?- SEE COMMENT ?Microscopic Comment ?3. The invasive adenocarcinoma appears moderately differentiated and invades the submucosa for a depth of 0.8 cm. The adenocarcinoma comes within 0.1 cm from the base of the polyp. High-grade features including lymphovascular space invasion, tumor budding or poor differentiation are not identified. Dr. Vic Ripper reviewed the case and agrees ?with the above diagnosis. ?  ?07/02/2021 Imaging  ? CT CAP IMPRESSION: ?Negative. No evidence of metastatic disease or other significant abnormality. ?  ?07/03/2021 Imaging  ? Pelvic MRI w/wo contrast IMPRESSION: ?Rectal adenocarcinoma T stage: T2.  Lesion not identified  ?Rectal adenocarcinoma N stage:  N0  ?Distance from tumor to the internal anal sphincter. Lesion not identified ?  ?07/08/2021 Initial Diagnosis  ? Rectal cancer ([place]) ?  ? ? ? ?INTERVAL HISTORY:  ?Kayla Hobbs was contacted for a follow up of rectal cancer. She was last seen by me on 07/08/21 in consultation with NP Lacie. ?She reports she is doing well overall, no new concerns.  ?  ?All other systems were reviewed with the patient and are negative. ? ?MEDICAL HISTORY:   ?Past Medical History:  ?Diagnosis Date  ? Arthritis   ? Hypertension   ? Skin cancer   ? basil top of heasd   ? ? ?SURGICAL HISTORY: ?Past Surgical History:  ?Procedure Laterality Date  ? PARTIAL HYSTERECTOMY  05/2020  ? due to heavy bleeding  ? SKIN CANCER EXCISION    ? ? ?I have reviewed the social history and family history with the patient and they are unchanged from previous note. ? ?ALLERGIES:  has No Known Allergies. ? ?MEDICATIONS:  ?Current Outpatient Medications  ?Medication Sig Dispense Refill  ? ondansetron (ZOFRAN) 8 MG tablet Take 1 tablet (8 mg total) by mouth every 8 (eight) hours as needed for nausea or vomiting. 15 tablet 0  ? amoxicillin (AMOXIL) 500 MG capsule Take 1 capsule (500 mg total) by mouth 3 (three) times daily for 10 days. 30 capsule 0  ? buPROPion (WELLBUTRIN SR) 150 MG 12 hr tablet Take 150 mg by mouth 2 (two) times daily.    ? busPIRone (BUSPAR) 10 MG tablet Take 10 mg by mouth 2 (two) times daily.    ? capecitabine (XELODA) 500 MG tablet Take 3 tablets (1,500 mg total) by mouth 2 (two) times daily after a meal. On days of radiation, Monday through Friday. 90 tablet 1  ? estradiol (ESTRACE) 0.5 MG tablet Take 0.5 mg by mouth daily.    ?  fluticasone (FLONASE) 50 MCG/ACT nasal spray Place 2 sprays into both nostrils daily. 16 g 0  ? hydrochlorothiazide (HYDRODIURIL) 50 MG tablet Take 50 mg by mouth daily.    ? potassium chloride SA (KLOR-CON M) 20 MEQ tablet Take 1 tablet (20 mEq total) by mouth 2 (two) times daily. 30 tablet 1  ? SUMAtriptan (IMITREX) 50 MG tablet Take 50 mg by mouth daily as needed. (Patient not taking: Reported on 07/08/2021)    ? ?No current facility-administered medications for this visit.  ? ? ?PHYSICAL EXAMINATION: ?ECOG PERFORMANCE STATUS: 0 - Asymptomatic ? ?There were no vitals filed for this visit. ?Wt Readings from Last 3 Encounters:  ?07/15/21 159 lb 12.8 oz (72.5 kg)  ?07/08/21 161 lb (73 kg)  ?06/18/21 158 lb (71.7 kg)  ?  ? ?No vitals taken today, Exam  not performed today ? ?LABORATORY DATA:  ?I have reviewed the data as listed ?CBC Latest Ref Rng & Units 07/15/2021 05/26/2021  ?WBC 4.0 - 10.5 K/uL 5.2 6.3  ?Hemoglobin 12.0 - 15.0 g/dL 14.4 13.9  ?Hematocrit 3

## 2021-07-23 NOTE — Telephone Encounter (Signed)
FMLA for both Kayla Hobbs and spouse Kayla Hobbs successfully returned via fax.   Original copies to alphabetical file folder behind appointment registration area near registrar number one for patient pick up on 07/27/2021.  No further completed form delivery actions performed or further instructions received. ? ? ? ?

## 2021-07-25 ENCOUNTER — Other Ambulatory Visit: Payer: Self-pay | Admitting: Hematology

## 2021-07-27 ENCOUNTER — Ambulatory Visit
Admission: RE | Admit: 2021-07-27 | Discharge: 2021-07-27 | Disposition: A | Discharge status: Home / Self Care | Payer: BLUE CROSS/BLUE SHIELD | Source: Ambulatory Visit | Attending: Radiation Oncology | Admitting: Radiation Oncology

## 2021-07-27 ENCOUNTER — Inpatient Hospital Stay: Payer: BLUE CROSS/BLUE SHIELD

## 2021-07-27 ENCOUNTER — Other Ambulatory Visit: Payer: Self-pay

## 2021-07-27 DIAGNOSIS — C20 Malignant neoplasm of rectum: Secondary | ICD-10-CM

## 2021-07-27 DIAGNOSIS — Z510 Encounter for antineoplastic radiation therapy: Secondary | ICD-10-CM | POA: Diagnosis not present

## 2021-07-27 LAB — CBC WITH DIFFERENTIAL (CANCER CENTER ONLY)
Abs Immature Granulocytes: 0.01 10*3/uL (ref 0.00–0.07)
Basophils Absolute: 0.1 10*3/uL (ref 0.0–0.1)
Basophils Relative: 1 %
Eosinophils Absolute: 0.1 10*3/uL (ref 0.0–0.5)
Eosinophils Relative: 2 %
HCT: 41.5 % (ref 36.0–46.0)
Hemoglobin: 14.3 g/dL (ref 12.0–15.0)
Immature Granulocytes: 0 %
Lymphocytes Relative: 46 %
Lymphs Abs: 3.2 10*3/uL (ref 0.7–4.0)
MCH: 31 pg (ref 26.0–34.0)
MCHC: 34.5 g/dL (ref 30.0–36.0)
MCV: 89.8 fL (ref 80.0–100.0)
Monocytes Absolute: 0.7 10*3/uL (ref 0.1–1.0)
Monocytes Relative: 10 %
Neutro Abs: 2.9 10*3/uL (ref 1.7–7.7)
Neutrophils Relative %: 41 %
Platelet Count: 240 10*3/uL (ref 150–400)
RBC: 4.62 MIL/uL (ref 3.87–5.11)
RDW: 13.2 % (ref 11.5–15.5)
WBC Count: 7 10*3/uL (ref 4.0–10.5)
nRBC: 0 % (ref 0.0–0.2)

## 2021-07-27 LAB — CMP (CANCER CENTER ONLY)
ALT: 16 U/L (ref 0–44)
AST: 19 U/L (ref 15–41)
Albumin: 4.3 g/dL (ref 3.5–5.0)
Alkaline Phosphatase: 81 U/L (ref 38–126)
Anion gap: 7 (ref 5–15)
BUN: 14 mg/dL (ref 6–20)
CO2: 30 mmol/L (ref 22–32)
Calcium: 9.2 mg/dL (ref 8.9–10.3)
Chloride: 99 mmol/L (ref 98–111)
Creatinine: 0.94 mg/dL (ref 0.44–1.00)
GFR, Estimated: >60 mL/min (ref >60)
Glucose, Bld: 91 mg/dL (ref 70–99)
Potassium: 3.1 mmol/L — ABNORMAL LOW (ref 3.5–5.1)
Sodium: 136 mmol/L (ref 135–145)
Total Bilirubin: 0.5 mg/dL (ref 0.3–1.2)
Total Protein: 7.3 g/dL (ref 6.5–8.1)

## 2021-07-27 LAB — CEA (IN HOUSE-CHCC): CEA (CHCC-In House): 3.04 ng/mL (ref 0.00–5.00)

## 2021-07-28 ENCOUNTER — Ambulatory Visit
Admission: RE | Admit: 2021-07-28 | Discharge: 2021-07-28 | Disposition: A | Discharge status: Home / Self Care | Payer: BLUE CROSS/BLUE SHIELD | Source: Ambulatory Visit | Attending: Radiation Oncology | Admitting: Radiation Oncology

## 2021-07-28 DIAGNOSIS — Z510 Encounter for antineoplastic radiation therapy: Secondary | ICD-10-CM | POA: Diagnosis not present

## 2021-07-29 ENCOUNTER — Other Ambulatory Visit: Payer: Self-pay

## 2021-07-29 ENCOUNTER — Other Ambulatory Visit: Payer: Self-pay | Admitting: Hematology

## 2021-07-29 ENCOUNTER — Ambulatory Visit
Admission: RE | Admit: 2021-07-29 | Discharge: 2021-07-29 | Disposition: A | Discharge status: Home / Self Care | Payer: BLUE CROSS/BLUE SHIELD | Source: Ambulatory Visit | Attending: Radiation Oncology | Admitting: Radiation Oncology

## 2021-07-29 DIAGNOSIS — Z510 Encounter for antineoplastic radiation therapy: Secondary | ICD-10-CM | POA: Diagnosis not present

## 2021-07-30 ENCOUNTER — Ambulatory Visit
Admission: RE | Admit: 2021-07-30 | Discharge: 2021-07-30 | Disposition: A | Discharge status: Home / Self Care | Payer: BLUE CROSS/BLUE SHIELD | Source: Ambulatory Visit | Attending: Radiation Oncology | Admitting: Radiation Oncology

## 2021-07-30 DIAGNOSIS — Z510 Encounter for antineoplastic radiation therapy: Secondary | ICD-10-CM | POA: Diagnosis not present

## 2021-07-30 NOTE — Progress Notes (Incomplete)
Pt here for patient teaching. Pt given Radiation and You booklet, skin care instructions, and Sonafine. Reviewed areas of pertinence such as diarrhea, fatigue, hair loss, sexual and fertility changes, skin changes, and urinary and bladder changes. Pt able to give teach back of to pat skin, use unscented/gentle soap, use baby wipes, have Imodium on hand, drink plenty of water, and sitz bath, apply Sonafine bid and avoid applying anything to skin within 4 hours of treatment. Pt verbalizes understanding of information given and will contact nursing with any questions or concerns.   ?

## 2021-07-31 ENCOUNTER — Ambulatory Visit
Admission: RE | Admit: 2021-07-31 | Discharge: 2021-07-31 | Disposition: A | Discharge status: Home / Self Care | Payer: BLUE CROSS/BLUE SHIELD | Source: Ambulatory Visit | Attending: Radiation Oncology | Admitting: Radiation Oncology

## 2021-07-31 ENCOUNTER — Other Ambulatory Visit: Payer: Self-pay

## 2021-07-31 DIAGNOSIS — C20 Malignant neoplasm of rectum: Secondary | ICD-10-CM

## 2021-07-31 DIAGNOSIS — Z510 Encounter for antineoplastic radiation therapy: Secondary | ICD-10-CM | POA: Diagnosis not present

## 2021-07-31 MED ORDER — SONAFINE EX EMUL
1.0000 "application " | Freq: Two times a day (BID) | CUTANEOUS | Status: DC
  Administered 2021-07-31: 1 via TOPICAL

## 2021-07-31 NOTE — Progress Notes (Signed)
Pt here for patient teaching. Pt given Radiation and You booklet, skin care instructions, and Sonafine. Reviewed areas of pertinence such as diarrhea, fatigue, hair loss, nausea and vomiting, sexual and fertility changes, skin changes, urinary and bladder changes, and headache. Pt able to give teach back of to pat skin, use unscented/gentle soap, use baby wipes, have Imodium on hand, drink plenty of water, and sitz bath, apply Sonafine bid, avoid applying anything to skin within 4 hours of treatment, and to use an electric razor if they must shave. Pt verbalizes understanding of information given and will contact nursing with any questions or concerns.     [email or web address]/treatmentinformation/[email or web address]      

## 2021-08-03 ENCOUNTER — Ambulatory Visit
Admission: RE | Admit: 2021-08-03 | Discharge: 2021-08-03 | Disposition: A | Discharge status: Home / Self Care | Payer: BLUE CROSS/BLUE SHIELD | Source: Ambulatory Visit | Attending: Radiation Oncology | Admitting: Radiation Oncology

## 2021-08-03 DIAGNOSIS — Z510 Encounter for antineoplastic radiation therapy: Secondary | ICD-10-CM | POA: Diagnosis not present

## 2021-08-04 ENCOUNTER — Ambulatory Visit
Admission: RE | Admit: 2021-08-04 | Discharge: 2021-08-04 | Disposition: A | Discharge status: Home / Self Care | Payer: BLUE CROSS/BLUE SHIELD | Source: Ambulatory Visit | Attending: Radiation Oncology | Admitting: Radiation Oncology

## 2021-08-04 ENCOUNTER — Other Ambulatory Visit: Payer: Self-pay

## 2021-08-04 DIAGNOSIS — Z510 Encounter for antineoplastic radiation therapy: Secondary | ICD-10-CM | POA: Diagnosis not present

## 2021-08-05 ENCOUNTER — Other Ambulatory Visit: Payer: Self-pay

## 2021-08-05 ENCOUNTER — Ambulatory Visit
Admission: RE | Admit: 2021-08-05 | Discharge: 2021-08-05 | Disposition: A | Discharge status: Home / Self Care | Payer: BLUE CROSS/BLUE SHIELD | Source: Ambulatory Visit | Attending: Radiation Oncology | Admitting: Radiation Oncology

## 2021-08-05 ENCOUNTER — Telehealth: Payer: Self-pay | Admitting: *Deleted

## 2021-08-05 DIAGNOSIS — Z510 Encounter for antineoplastic radiation therapy: Secondary | ICD-10-CM | POA: Diagnosis not present

## 2021-08-05 NOTE — Telephone Encounter (Signed)
Disability paperwork received 07/28/2021 completed today by this forms nurse.  Form to designated mail area for collaborative pick up for provider review and signature.      ?

## 2021-08-06 ENCOUNTER — Encounter: Payer: Self-pay | Admitting: Hematology

## 2021-08-06 ENCOUNTER — Inpatient Hospital Stay: Payer: BLUE CROSS/BLUE SHIELD

## 2021-08-06 ENCOUNTER — Ambulatory Visit
Admission: RE | Admit: 2021-08-06 | Discharge: 2021-08-06 | Disposition: A | Discharge status: Home / Self Care | Payer: BLUE CROSS/BLUE SHIELD | Source: Ambulatory Visit | Attending: Radiation Oncology | Admitting: Radiation Oncology

## 2021-08-06 ENCOUNTER — Inpatient Hospital Stay (HOSPITAL_BASED_OUTPATIENT_CLINIC_OR_DEPARTMENT_OTHER): Payer: BLUE CROSS/BLUE SHIELD | Admitting: Hematology

## 2021-08-06 VITALS — BP 119/89 | HR 87 | Temp 98.3°F | Resp 17 | Ht 64.0 in | Wt 160.9 lb

## 2021-08-06 DIAGNOSIS — Z510 Encounter for antineoplastic radiation therapy: Secondary | ICD-10-CM | POA: Diagnosis not present

## 2021-08-06 DIAGNOSIS — C20 Malignant neoplasm of rectum: Secondary | ICD-10-CM | POA: Diagnosis not present

## 2021-08-06 LAB — CMP (CANCER CENTER ONLY)
ALT: 21 U/L (ref 0–44)
AST: 19 U/L (ref 15–41)
Albumin: 4.1 g/dL (ref 3.5–5.0)
Alkaline Phosphatase: 85 U/L (ref 38–126)
Anion gap: 7 (ref 5–15)
BUN: 15 mg/dL (ref 6–20)
CO2: 32 mmol/L (ref 22–32)
Calcium: 9.4 mg/dL (ref 8.9–10.3)
Chloride: 103 mmol/L (ref 98–111)
Creatinine: 0.92 mg/dL (ref 0.44–1.00)
GFR, Estimated: >60 mL/min (ref >60)
Glucose, Bld: 80 mg/dL (ref 70–99)
Potassium: 3.2 mmol/L — ABNORMAL LOW (ref 3.5–5.1)
Sodium: 142 mmol/L (ref 135–145)
Total Bilirubin: 0.3 mg/dL (ref 0.3–1.2)
Total Protein: 6.7 g/dL (ref 6.5–8.1)

## 2021-08-06 LAB — CBC WITH DIFFERENTIAL (CANCER CENTER ONLY)
Abs Immature Granulocytes: 0.01 10*3/uL (ref 0.00–0.07)
Basophils Absolute: 0 10*3/uL (ref 0.0–0.1)
Basophils Relative: 1 %
Eosinophils Absolute: 0.1 10*3/uL (ref 0.0–0.5)
Eosinophils Relative: 4 %
HCT: 42.3 % (ref 36.0–46.0)
Hemoglobin: 14.6 g/dL (ref 12.0–15.0)
Immature Granulocytes: 0 %
Lymphocytes Relative: 31 %
Lymphs Abs: 1.1 10*3/uL (ref 0.7–4.0)
MCH: 31.7 pg (ref 26.0–34.0)
MCHC: 34.5 g/dL (ref 30.0–36.0)
MCV: 91.8 fL (ref 80.0–100.0)
Monocytes Absolute: 0.5 10*3/uL (ref 0.1–1.0)
Monocytes Relative: 14 %
Neutro Abs: 1.8 10*3/uL (ref 1.7–7.7)
Neutrophils Relative %: 50 %
Platelet Count: 231 10*3/uL (ref 150–400)
RBC: 4.61 MIL/uL (ref 3.87–5.11)
RDW: 13.3 % (ref 11.5–15.5)
WBC Count: 3.4 10*3/uL — ABNORMAL LOW (ref 4.0–10.5)
nRBC: 0 % (ref 0.0–0.2)

## 2021-08-06 NOTE — Telephone Encounter (Signed)
Disability paperwork completed and ready for pick up.  Original copy to alphabetical file folder behind appointment registration area one for patient pick up as requested per patient.  No return fax number or further instructions received.   ?Unable to connect with Kayla Hobbs [phone]) to confirm return methods.  Message left advising Kayla Hobbs of above.  E-mailed copy to patient e-mail for return via upload or forward if needed. ? ? ?

## 2021-08-06 NOTE — Progress Notes (Addendum)
?[place]   ?[phone] [phone]   ?Clinic Follow up Note  ? ?Patient Care Team: ?Patient, No Pcp Per (Inactive) as PCP - General (General Practice) ?Leighton Ruff, MD as Consulting Physician (General Surgery) ?Truitt Merle, MD as Consulting Physician (Hematology) ?Earl Gala, Deliah Goody, RN as Sales executive (Oncology) ?Alla Feeling, NP as Nurse Practitioner (Nurse Practitioner) ? ?Date of Service:  08/06/2021 ? ?CHIEF COMPLAINT: f/u of rectal cancer ? ?CURRENT THERAPY:  ?Concurrent chemoRT with Xeloda, started 07/27/21 ?-Xeloda dose: 1500 mg BID M-F ? ?ASSESSMENT & PLAN:  ?Kayla Hobbs is a 51 y.o. female with  ? ?1. Moderately differentiated rectal adenocarcinoma, G2, cT2N0M0 stage I ?-presented with rectal bleeding in 07/2019, but patient cancelled colonoscopy at that time. She had persistent bleeding and presented back to GI 06/2021, work up showed a distal rectal polyp palpable on DRE. Colonoscopy on 06/18/21 by Dr. Havery [name] showed a ~3 cm polyp, path revealed moderately differentiated adenocarcinoma invading submucosa to a dept of 8 mm, with a close (0.1 cm) but negative margin.   ?-Staging CT CAP on 07/02/21 was negative for local regional adenopathy or distant metastasis. ?-Staging pelvic MRI on 07/03/21 showed local inflammation but no identifiable lesion. ?-MMR testing on biopsy was normal. ?-she started concurrent chemoRT on 07/15/21 with Xeloda 1500 mg BID and radiation under Dr. Lisbeth Renshaw. She is tolerating Xeloda well with no noticeable side effects. She has some rectal irritation from radiation. ?  ?2. HTN ?-on HCTZ  ?  ?3. Substance use ?-she started Welbutrin to quit smoking, managed to quit 5 years ago. She continues welbutrin, as well as Buspar ?-she drinks wine nightly  ?-will review cessation at a later date ?  ?4. Genetics ?-patient had genetic testing [facility], [facility] negative.  We will request the report ?-Patient has 3  healthy daughters, oldest is 3.  Recommendation is for them to begin colorectal cancer screening at age 57 (36 years younger than patient at her diagnosis), or sooner if they have for personal red flags ?  ?  ?PLAN: ?-proceed to radiation today and daily ?-continue Xeloda on radiation treatment days ?-I called in Zofran ?-lab and f/u in 2 weeks as scheduled ? ? ?No problem-specific Assessment & Plan notes found for this encounter. ? ? ?SUMMARY OF ONCOLOGIC HISTORY: ?Oncology History  ?Rectal cancer ([place])  ?06/18/2021 Cancer Staging  ? Staging form: Colon and Rectum, AJCC 8th Edition ?- Clinical stage from 06/18/2021: Stage I (cT1, cN0, cM0) - Signed by Truitt Merle, MD on 07/11/2021 ?Stage prefix: Initial diagnosis ?Total positive nodes: 0 ?Histologic grade (G): G2 ?Histologic grading system: 4 grade system ? ?  ?06/18/2021 Procedure  ? Colonoscopy per Dr. Havery [name], impression ?- Polypoid lesion found on digital rectal exam. ?- One diminutive polyp in the ascending colon, removed with a cold biopsy forceps. Resected and retrieved. ?- One 5 mm polyp in the sigmoid colon, removed with a cold snare. Resected and retrieved. ?- One large polyp in the distal rectum. Resected and retrieved as outlined. Injected/tattooed. ?Clips were placed. ?- Polypoid lesions in the distal rectum. Biopsied. ?- Internal hemorrhoids. ?- The examination was otherwise normal. ?  ?06/18/2021 Initial Biopsy  ? Diagnosis ?1. Surgical [P], colon, distal rectal biopsy ?- CONDYLOMA ACUMINATA ?2. Surgical [P], colon, ascending, sigmoid, polyp (2) ?- TUBULAR ADENOMA (2 OF 2 FRAGMENTS) ?- NO HIGH-GRADE DYSPLASIA OR MALIGNANCY IDENTIFIED ?3. Surgical [P], colon, rectum, polyp (1) ?- ADENOCARCINOMA ARISING IN A TUBULAR ADENOMA WITH  HIGH-GRADE DYSPLASIA ?- SEE COMMENT ?Microscopic Comment ?3. The invasive adenocarcinoma appears moderately differentiated and invades the submucosa for a depth of 0.8 cm. The adenocarcinoma comes within 0.1 cm from the base of the  polyp. High-grade features including lymphovascular space invasion, tumor budding or poor differentiation are not identified. Dr. Vic Ripper reviewed the case and agrees ?with the above diagnosis. ?  ?07/02/2021 Imaging  ? CT CAP IMPRESSION: ?Negative. No evidence of metastatic disease or other significant abnormality. ?  ?07/03/2021 Imaging  ? Pelvic MRI w/wo contrast IMPRESSION: ?Rectal adenocarcinoma T stage: T2.  Lesion not identified  ?Rectal adenocarcinoma N stage:  N0  ?Distance from tumor to the internal anal sphincter. Lesion not identified ?  ?07/08/2021 Initial Diagnosis  ? Rectal cancer ([place]) ?  ? ? ? ?INTERVAL HISTORY:  ?Kayla Hobbs is here for a follow up of rectal cancer. She was last seen by me on 07/23/21. She presents to the clinic alone. ?She reports some rectal irritation from radiation, which is common and expected. She reports she is tolerating Xeloda well with no noticeable side effects. ?  ?All other systems were reviewed with the patient and are negative. ? ?MEDICAL HISTORY:  ?Past Medical History:  ?Diagnosis Date  ? Arthritis   ? Hypertension   ? Skin cancer   ? basil top of heasd   ? ? ?SURGICAL HISTORY: ?Past Surgical History:  ?Procedure Laterality Date  ? PARTIAL HYSTERECTOMY  05/2020  ? due to heavy bleeding  ? SKIN CANCER EXCISION    ? ? ?I have reviewed the social history and family history with the patient and they are unchanged from previous note. ? ?ALLERGIES:  has No Known Allergies. ? ?MEDICATIONS:  ?Current Outpatient Medications  ?Medication Sig Dispense Refill  ? buPROPion (WELLBUTRIN SR) 150 MG 12 hr tablet Take 150 mg by mouth 2 (two) times daily.    ? busPIRone (BUSPAR) 10 MG tablet Take 10 mg by mouth 2 (two) times daily.    ? capecitabine (XELODA) 500 MG tablet Take 3 tablets (1,500 mg total) by mouth 2 (two) times daily after a meal. On days of radiation, Monday through Friday. 90 tablet 1  ? estradiol (ESTRACE) 0.5 MG tablet Take 0.5 mg by mouth daily.    ? fluticasone  (FLONASE) 50 MCG/ACT nasal spray Place 2 sprays into both nostrils daily. 16 g 0  ? hydrochlorothiazide (HYDRODIURIL) 50 MG tablet Take 50 mg by mouth daily.    ? KLOR-CON M20 20 MEQ tablet TAKE 1 TABLET BY MOUTH TWICE A DAY 30 tablet 1  ? ondansetron (ZOFRAN) 8 MG tablet Take 1 tablet (8 mg total) by mouth every 8 (eight) hours as needed for nausea or vomiting. 15 tablet 0  ? SUMAtriptan (IMITREX) 50 MG tablet Take 50 mg by mouth daily as needed. (Patient not taking: Reported on 07/08/2021)    ? ?No current facility-administered medications for this visit.  ? ? ?PHYSICAL EXAMINATION: ?ECOG PERFORMANCE STATUS: 1 - Symptomatic but completely ambulatory ? ?Vitals:  ? 08/06/21 1359  ?BP: 119/89  ?Pulse: 87  ?Resp: 17  ?Temp: 98.3 ?F (36.8 ?C)  ?SpO2: 100%  ? ?Wt Readings from Last 3 Encounters:  ?08/06/21 160 lb 14.4 oz (73 kg)  ?07/15/21 159 lb 12.8 oz (72.5 kg)  ?07/08/21 161 lb (73 kg)  ?  ? ?GENERAL:alert, no distress and comfortable ?SKIN: skin color, texture, turgor are normal, no rashes or significant lesions ?EYES: normal, Conjunctiva are pink and non-injected, sclera clear ?NEURO: alert &  oriented x 3 with fluent speech, no focal motor/sensory deficits ?RECTAL: (external only)  mild external hemorrhoids present, non-bleeding. Skin is tender, no ulcers or discharge. ? ?LABORATORY DATA:  ?I have reviewed the data as listed ? ?  Latest Ref Rng & Units 08/06/2021  ?  1:26 PM 07/27/2021  ?  2:13 PM 07/15/2021  ? 10:08 AM  ?CBC  ?WBC 4.0 - 10.5 K/uL 3.4   7.0   5.2    ?Hemoglobin 12.0 - 15.0 g/dL 14.6   14.3   14.4    ?Hematocrit 36.0 - 46.0 % 42.3   41.5   40.3    ?Platelets 150 - 400 K/uL 231   240   210    ? ? ? ? ?  Latest Ref Rng & Units 08/06/2021  ?  1:26 PM 07/27/2021  ?  2:13 PM 07/15/2021  ? 10:08 AM  ?CMP  ?Glucose 70 - 99 mg/dL 80   91   93    ?BUN 6 - 20 mg/dL 15   14   13     ?Creatinine 0.44 - 1.00 mg/dL 0.92   0.94   0.96    ?Sodium 135 - 145 mmol/L 142   136   141    ?Potassium 3.5 - 5.1 mmol/L 3.2   3.1   3.0     ?Chloride 98 - 111 mmol/L 103   99   99    ?CO2 22 - 32 mmol/L 32   30   35    ?Calcium 8.9 - 10.3 mg/dL 9.4   9.2   10.1    ?Total Protein 6.5 - 8.1 g/dL 6.7   7.3   6.9    ?Total Bilirubin 0.3 - 1.2 m

## 2021-08-07 ENCOUNTER — Other Ambulatory Visit: Payer: Self-pay

## 2021-08-07 ENCOUNTER — Ambulatory Visit
Admission: RE | Admit: 2021-08-07 | Discharge: 2021-08-07 | Disposition: A | Discharge status: Home / Self Care | Payer: BLUE CROSS/BLUE SHIELD | Source: Ambulatory Visit | Attending: Radiation Oncology | Admitting: Radiation Oncology

## 2021-08-07 DIAGNOSIS — Z510 Encounter for antineoplastic radiation therapy: Secondary | ICD-10-CM | POA: Diagnosis not present

## 2021-08-10 ENCOUNTER — Ambulatory Visit
Admission: RE | Admit: 2021-08-10 | Discharge: 2021-08-10 | Disposition: A | Discharge status: Home / Self Care | Payer: BLUE CROSS/BLUE SHIELD | Source: Ambulatory Visit | Attending: Radiation Oncology | Admitting: Radiation Oncology

## 2021-08-10 ENCOUNTER — Other Ambulatory Visit: Payer: Self-pay | Admitting: Hematology

## 2021-08-10 DIAGNOSIS — C20 Malignant neoplasm of rectum: Secondary | ICD-10-CM | POA: Insufficient documentation

## 2021-08-10 DIAGNOSIS — Z510 Encounter for antineoplastic radiation therapy: Secondary | ICD-10-CM | POA: Diagnosis not present

## 2021-08-11 ENCOUNTER — Ambulatory Visit
Admission: RE | Admit: 2021-08-11 | Discharge: 2021-08-11 | Disposition: A | Discharge status: Home / Self Care | Payer: BLUE CROSS/BLUE SHIELD | Source: Ambulatory Visit | Attending: Radiation Oncology | Admitting: Radiation Oncology

## 2021-08-11 ENCOUNTER — Other Ambulatory Visit: Payer: Self-pay

## 2021-08-11 DIAGNOSIS — Z510 Encounter for antineoplastic radiation therapy: Secondary | ICD-10-CM | POA: Diagnosis not present

## 2021-08-12 ENCOUNTER — Ambulatory Visit
Admission: RE | Admit: 2021-08-12 | Discharge: 2021-08-12 | Disposition: A | Discharge status: Home / Self Care | Payer: BLUE CROSS/BLUE SHIELD | Source: Ambulatory Visit | Attending: Radiation Oncology | Admitting: Radiation Oncology

## 2021-08-12 DIAGNOSIS — Z510 Encounter for antineoplastic radiation therapy: Secondary | ICD-10-CM | POA: Diagnosis not present

## 2021-08-13 ENCOUNTER — Other Ambulatory Visit: Payer: Self-pay

## 2021-08-13 ENCOUNTER — Inpatient Hospital Stay: Payer: BLUE CROSS/BLUE SHIELD

## 2021-08-13 ENCOUNTER — Ambulatory Visit
Admission: RE | Admit: 2021-08-13 | Discharge: 2021-08-13 | Disposition: A | Discharge status: Home / Self Care | Payer: BLUE CROSS/BLUE SHIELD | Source: Ambulatory Visit | Attending: Radiation Oncology | Admitting: Radiation Oncology

## 2021-08-13 DIAGNOSIS — K6289 Other specified diseases of anus and rectum: Secondary | ICD-10-CM | POA: Insufficient documentation

## 2021-08-13 DIAGNOSIS — Z79899 Other long term (current) drug therapy: Secondary | ICD-10-CM | POA: Insufficient documentation

## 2021-08-13 DIAGNOSIS — F1990 Other psychoactive substance use, unspecified, uncomplicated: Secondary | ICD-10-CM | POA: Insufficient documentation

## 2021-08-13 DIAGNOSIS — E876 Hypokalemia: Secondary | ICD-10-CM | POA: Insufficient documentation

## 2021-08-13 DIAGNOSIS — Z510 Encounter for antineoplastic radiation therapy: Secondary | ICD-10-CM | POA: Diagnosis not present

## 2021-08-13 DIAGNOSIS — C20 Malignant neoplasm of rectum: Secondary | ICD-10-CM | POA: Insufficient documentation

## 2021-08-13 DIAGNOSIS — I10 Essential (primary) hypertension: Secondary | ICD-10-CM | POA: Insufficient documentation

## 2021-08-13 DIAGNOSIS — Z87891 Personal history of nicotine dependence: Secondary | ICD-10-CM | POA: Insufficient documentation

## 2021-08-13 LAB — CMP (CANCER CENTER ONLY)
ALT: 15 U/L (ref 0–44)
AST: 16 U/L (ref 15–41)
Albumin: 3.9 g/dL (ref 3.5–5.0)
Alkaline Phosphatase: 78 U/L (ref 38–126)
Anion gap: 7 (ref 5–15)
BUN: 16 mg/dL (ref 6–20)
CO2: 31 mmol/L (ref 22–32)
Calcium: 9.3 mg/dL (ref 8.9–10.3)
Chloride: 102 mmol/L (ref 98–111)
Creatinine: 0.99 mg/dL (ref 0.44–1.00)
GFR, Estimated: >60 mL/min (ref >60)
Glucose, Bld: 86 mg/dL (ref 70–99)
Potassium: 3.2 mmol/L — ABNORMAL LOW (ref 3.5–5.1)
Sodium: 140 mmol/L (ref 135–145)
Total Bilirubin: 0.3 mg/dL (ref 0.3–1.2)
Total Protein: 6.5 g/dL (ref 6.5–8.1)

## 2021-08-13 LAB — CBC WITH DIFFERENTIAL (CANCER CENTER ONLY)
Abs Immature Granulocytes: 0.02 10*3/uL (ref 0.00–0.07)
Basophils Absolute: 0 10*3/uL (ref 0.0–0.1)
Basophils Relative: 0 %
Eosinophils Absolute: 0.3 10*3/uL (ref 0.0–0.5)
Eosinophils Relative: 6 %
HCT: 39.8 % (ref 36.0–46.0)
Hemoglobin: 13.6 g/dL (ref 12.0–15.0)
Immature Granulocytes: 0 %
Lymphocytes Relative: 13 %
Lymphs Abs: 0.7 10*3/uL (ref 0.7–4.0)
MCH: 31 pg (ref 26.0–34.0)
MCHC: 34.2 g/dL (ref 30.0–36.0)
MCV: 90.7 fL (ref 80.0–100.0)
Monocytes Absolute: 0.6 10*3/uL (ref 0.1–1.0)
Monocytes Relative: 11 %
Neutro Abs: 3.8 10*3/uL (ref 1.7–7.7)
Neutrophils Relative %: 70 %
Platelet Count: 195 10*3/uL (ref 150–400)
RBC: 4.39 MIL/uL (ref 3.87–5.11)
RDW: 13.9 % (ref 11.5–15.5)
WBC Count: 5.4 10*3/uL (ref 4.0–10.5)
nRBC: 0 % (ref 0.0–0.2)

## 2021-08-14 ENCOUNTER — Ambulatory Visit
Admission: RE | Admit: 2021-08-14 | Discharge: 2021-08-14 | Disposition: A | Discharge status: Home / Self Care | Payer: BLUE CROSS/BLUE SHIELD | Source: Ambulatory Visit | Attending: Radiation Oncology | Admitting: Radiation Oncology

## 2021-08-14 DIAGNOSIS — Z510 Encounter for antineoplastic radiation therapy: Secondary | ICD-10-CM | POA: Diagnosis not present

## 2021-08-17 ENCOUNTER — Other Ambulatory Visit: Payer: Self-pay

## 2021-08-17 ENCOUNTER — Ambulatory Visit
Admission: RE | Admit: 2021-08-17 | Discharge: 2021-08-17 | Disposition: A | Discharge status: Home / Self Care | Payer: BLUE CROSS/BLUE SHIELD | Source: Ambulatory Visit | Attending: Radiation Oncology | Admitting: Radiation Oncology

## 2021-08-17 DIAGNOSIS — Z510 Encounter for antineoplastic radiation therapy: Secondary | ICD-10-CM | POA: Diagnosis not present

## 2021-08-18 ENCOUNTER — Encounter: Payer: Self-pay | Admitting: Hematology

## 2021-08-18 ENCOUNTER — Ambulatory Visit
Admission: RE | Admit: 2021-08-18 | Discharge: 2021-08-18 | Disposition: A | Discharge status: Home / Self Care | Payer: BLUE CROSS/BLUE SHIELD | Source: Ambulatory Visit | Attending: Radiation Oncology | Admitting: Radiation Oncology

## 2021-08-18 DIAGNOSIS — Z510 Encounter for antineoplastic radiation therapy: Secondary | ICD-10-CM | POA: Diagnosis not present

## 2021-08-19 ENCOUNTER — Other Ambulatory Visit: Payer: Self-pay

## 2021-08-19 ENCOUNTER — Inpatient Hospital Stay: Payer: BLUE CROSS/BLUE SHIELD

## 2021-08-19 ENCOUNTER — Ambulatory Visit
Admission: RE | Admit: 2021-08-19 | Discharge: 2021-08-19 | Disposition: A | Discharge status: Home / Self Care | Payer: BLUE CROSS/BLUE SHIELD | Source: Ambulatory Visit | Attending: Radiation Oncology | Admitting: Radiation Oncology

## 2021-08-19 ENCOUNTER — Encounter: Payer: Self-pay | Admitting: Hematology

## 2021-08-19 ENCOUNTER — Inpatient Hospital Stay (HOSPITAL_BASED_OUTPATIENT_CLINIC_OR_DEPARTMENT_OTHER): Payer: BLUE CROSS/BLUE SHIELD | Admitting: Hematology

## 2021-08-19 VITALS — BP 118/81 | HR 84 | Temp 98.5°F | Resp 16

## 2021-08-19 DIAGNOSIS — C20 Malignant neoplasm of rectum: Secondary | ICD-10-CM

## 2021-08-19 DIAGNOSIS — Z510 Encounter for antineoplastic radiation therapy: Secondary | ICD-10-CM | POA: Diagnosis not present

## 2021-08-19 LAB — CMP (CANCER CENTER ONLY)
ALT: 14 U/L (ref 0–44)
AST: 16 U/L (ref 15–41)
Albumin: 4.2 g/dL (ref 3.5–5.0)
Alkaline Phosphatase: 64 U/L (ref 38–126)
Anion gap: 7 (ref 5–15)
BUN: 13 mg/dL (ref 6–20)
CO2: 30 mmol/L (ref 22–32)
Calcium: 9.4 mg/dL (ref 8.9–10.3)
Chloride: 101 mmol/L (ref 98–111)
Creatinine: 0.88 mg/dL (ref 0.44–1.00)
GFR, Estimated: >60 mL/min (ref >60)
Glucose, Bld: 95 mg/dL (ref 70–99)
Potassium: 2.9 mmol/L — ABNORMAL LOW (ref 3.5–5.1)
Sodium: 138 mmol/L (ref 135–145)
Total Bilirubin: 0.6 mg/dL (ref 0.3–1.2)
Total Protein: 7 g/dL (ref 6.5–8.1)

## 2021-08-19 LAB — CBC WITH DIFFERENTIAL (CANCER CENTER ONLY)
Abs Immature Granulocytes: 0.01 10*3/uL (ref 0.00–0.07)
Basophils Absolute: 0 10*3/uL (ref 0.0–0.1)
Basophils Relative: 0 %
Eosinophils Absolute: 0.2 10*3/uL (ref 0.0–0.5)
Eosinophils Relative: 6 %
HCT: 39.9 % (ref 36.0–46.0)
Hemoglobin: 13.4 g/dL (ref 12.0–15.0)
Immature Granulocytes: 0 %
Lymphocytes Relative: 16 %
Lymphs Abs: 0.7 10*3/uL (ref 0.7–4.0)
MCH: 30.9 pg (ref 26.0–34.0)
MCHC: 33.6 g/dL (ref 30.0–36.0)
MCV: 91.9 fL (ref 80.0–100.0)
Monocytes Absolute: 0.6 10*3/uL (ref 0.1–1.0)
Monocytes Relative: 13 %
Neutro Abs: 2.8 10*3/uL (ref 1.7–7.7)
Neutrophils Relative %: 65 %
Platelet Count: 191 10*3/uL (ref 150–400)
RBC: 4.34 MIL/uL (ref 3.87–5.11)
RDW: 14.6 % (ref 11.5–15.5)
WBC Count: 4.4 10*3/uL (ref 4.0–10.5)
nRBC: 0 % (ref 0.0–0.2)

## 2021-08-19 MED ORDER — ACETAMINOPHEN-CODEINE #3 300-30 MG PO TABS: 1.0000 | ORAL_TABLET | Freq: Four times a day (QID) | ORAL | 0 refills | Status: DC | PRN

## 2021-08-19 NOTE — Progress Notes (Signed)
?[place]   ?Telephone:(336) [phone] [phone]   ?Clinic Follow up Note  ? ?Patient Care Team: ?Patient, No Pcp Per (Inactive) as PCP - General (General Practice) ?Kayla Ruff, MD as Consulting Physician (General Surgery) ?Kayla Merle, MD as Consulting Physician (Hematology) ?Kayla Hobbs, Kayla Goody, RN as Sales executive (Oncology) ?Kayla Feeling, NP as Nurse Practitioner (Nurse Practitioner) ? ?Date of Service:  08/19/2021 ? ?CHIEF COMPLAINT: f/u of rectal cancer ? ?CURRENT THERAPY:  ?Concurrent chemoRT with Xeloda, started 07/27/21 ?-Xeloda dose: 1500 mg BID M-F ? ?ASSESSMENT & PLAN:  ?Kayla Hobbs is a 51 y.o. female with  ? ?1. Moderately differentiated rectal adenocarcinoma, G2, cT2N0M0 stage I ?-presented with rectal bleeding in 07/2019, but patient cancelled colonoscopy at that time. She had persistent bleeding and presented back to GI 06/2021, work up showed a distal rectal polyp palpable on DRE. Colonoscopy on 06/18/21 by Dr. Havery [name] showed a ~3 cm polyp, path revealed moderately differentiated adenocarcinoma invading submucosa to a dept of 8 mm, with a close (0.1 cm) but negative margin.   ?-Staging CT CAP on 07/02/21 was negative for local regional adenopathy or distant metastasis. ?-Staging pelvic MRI on 07/03/21 showed local inflammation but no identifiable lesion. ?-MMR testing on biopsy was normal. ?-she started concurrent chemoRT on 07/15/21 with Xeloda 1500 mg BID and radiation under Dr. Lisbeth Renshaw. She is tolerating Xeloda well with no noticeable side effects.  ?-she started having rectal pain a few days ago, has mild to moderate radiation dermatitis, few shallow ulcers.  I discussed the topical hydrocortisone, lidocaine, and sitz bath. I called in Tylenol #3 for her. ?-labs reviewed, overall WNL. I discussed pelvic floor rehab with her and offered a brochure. I will go ahead and refer her. ? ?2. Symptom Management: Rectal Pain ?-secondary to radiation ?-not well managed on  OTC pain medication. I will call in tylenol #3. ?  ?3. HTN ?-on HCTZ  ?-I recommend her to monitor her blood pressure at home, and hold on HCTZ if SBP less than 100 ?  ?4. Substance use ?-she started Welbutrin to quit smoking, managed to quit 5 years ago. She continues welbutrin, as well as Buspar ?-she drinks wine nightly  ?-will review cessation at a later date ?  ?5. Genetics ?-patient had genetic testing [facility], [facility] negative.  We will request the report ?-Patient has 3 healthy daughters, oldest is 29.  Recommendation is for them to begin colorectal cancer screening at age 28 (15 years younger than patient at her diagnosis), or sooner if they have for personal red flags ?  ?  ?PLAN: ?-proceed to radiation today and daily ?-continue Xeloda at same dose on radiation treatment days ?-I called in tylenol #3 ?-lab and f/u in 2 weeks as scheduled ? ? ?No problem-specific Assessment & Plan notes found for this encounter. ? ? ?SUMMARY OF ONCOLOGIC HISTORY: ?Oncology History  ?Rectal cancer ([place])  ?06/18/2021 Cancer Staging  ? Staging form: Colon and Rectum, AJCC 8th Edition ?- Clinical stage from 06/18/2021: Stage I (cT1, cN0, cM0) - Signed by Kayla Merle, MD on 07/11/2021 ?Stage prefix: Initial diagnosis ?Total positive nodes: 0 ?Histologic grade (G): G2 ?Histologic grading system: 4 grade system ? ?  ?06/18/2021 Procedure  ? Colonoscopy per Dr. Havery [name], impression ?- Polypoid lesion found on digital rectal exam. ?- One diminutive polyp in the ascending colon, removed with a cold biopsy forceps. Resected and retrieved. ?- One 5 mm polyp in the sigmoid colon, removed  with a cold snare. Resected and retrieved. ?- One large polyp in the distal rectum. Resected and retrieved as outlined. Injected/tattooed. ?Clips were placed. ?- Polypoid lesions in the distal rectum. Biopsied. ?- Internal hemorrhoids. ?- The examination was otherwise normal. ?  ?06/18/2021 Initial Biopsy  ? Diagnosis ?1.  Surgical [P], colon, distal rectal biopsy ?- CONDYLOMA ACUMINATA ?2. Surgical [P], colon, ascending, sigmoid, polyp (2) ?- TUBULAR ADENOMA (2 OF 2 FRAGMENTS) ?- NO HIGH-GRADE DYSPLASIA OR MALIGNANCY IDENTIFIED ?3. Surgical [P], colon, rectum, polyp (1) ?- ADENOCARCINOMA ARISING IN A TUBULAR ADENOMA WITH HIGH-GRADE DYSPLASIA ?- SEE COMMENT ?Microscopic Comment ?3. The invasive adenocarcinoma appears moderately differentiated and invades the submucosa for a depth of 0.8 cm. The adenocarcinoma comes within 0.1 cm from the base of the polyp. High-grade features including lymphovascular space invasion, tumor budding or poor differentiation are not identified. Dr. Vic Ripper reviewed the case and agrees ?with the above diagnosis. ?  ?07/02/2021 Imaging  ? CT CAP IMPRESSION: ?Negative. No evidence of metastatic disease or other significant abnormality. ?  ?07/03/2021 Imaging  ? Pelvic MRI w/wo contrast IMPRESSION: ?Rectal adenocarcinoma T stage: T2.  Lesion not identified  ?Rectal adenocarcinoma N stage:  N0  ?Distance from tumor to the internal anal sphincter. Lesion not identified ?  ?07/08/2021 Initial Diagnosis  ? Rectal cancer ([place]) ?  ? ? ? ?INTERVAL HISTORY:  ?Kayla Hobbs is here for a follow up of rectal cancer. She was last seen by me on 08/06/21. She presents to the clinic accompanied by her mother Thayer Headings. ?She reports she is having some rectal bleeding when she wipes. She also reports worsening rectal pain, exacerbated by bowel movements. She notes her BM are soft. She endorses using OTC pain medication with minimal relief. ?  ?All other systems were reviewed with the patient and are negative. ? ?MEDICAL HISTORY:  ?Past Medical History:  ?Diagnosis Date  ? Arthritis   ? Hypertension   ? Skin cancer   ? basil top of heasd   ? ? ?SURGICAL HISTORY: ?Past Surgical History:  ?Procedure Laterality Date  ? PARTIAL HYSTERECTOMY  05/2020  ? due to heavy bleeding  ? SKIN CANCER EXCISION    ? ? ?I have reviewed the social  history and family history with the patient and they are unchanged from previous note. ? ?ALLERGIES:  has No Known Allergies. ? ?MEDICATIONS:  ?Current Outpatient Medications  ?Medication Sig Dispense Refill  ? acetaminophen-codeine (TYLENOL #3) 300-30 MG tablet Take 1 tablet by mouth every 6 (six) hours as needed for moderate pain. 20 tablet 0  ? buPROPion (WELLBUTRIN SR) 150 MG 12 hr tablet Take 150 mg by mouth 2 (two) times daily.    ? busPIRone (BUSPAR) 10 MG tablet Take 10 mg by mouth 2 (two) times daily.    ? capecitabine (XELODA) 500 MG tablet TAKE 3 TABLETS BY MOUTH 2 TIMES DAILY AFTER A MEAL ON DAYS OF RADIATION, MONDAY THROUGH FRIDAY. 90 tablet 0  ? estradiol (ESTRACE) 0.5 MG tablet Take 0.5 mg by mouth daily.    ? fluticasone (FLONASE) 50 MCG/ACT nasal spray Place 2 sprays into both nostrils daily. 16 g 0  ? hydrochlorothiazide (HYDRODIURIL) 50 MG tablet Take 50 mg by mouth daily.    ? KLOR-CON M20 20 MEQ tablet TAKE 1 TABLET BY MOUTH TWICE A DAY 30 tablet 1  ? ondansetron (ZOFRAN) 8 MG tablet Take 1 tablet (8 mg total) by mouth every 8 (eight) hours as needed for nausea or vomiting. 15 tablet 0  ?  SUMAtriptan (IMITREX) 50 MG tablet Take 50 mg by mouth daily as needed. (Patient not taking: Reported on 07/08/2021)    ? ?No current facility-administered medications for this visit.  ? ? ?PHYSICAL EXAMINATION: ?ECOG PERFORMANCE STATUS: 1 - Symptomatic but completely ambulatory ? ?Vitals:  ? 08/19/21 1456  ?BP: 118/81  ?Pulse: 84  ?Resp: 16  ?Temp: 98.5 ?F (36.9 ?C)  ?SpO2: 100%  ? ?Wt Readings from Last 3 Encounters:  ?08/06/21 160 lb 14.4 oz (73 kg)  ?07/15/21 159 lb 12.8 oz (72.5 kg)  ?07/08/21 161 lb (73 kg)  ?  ? ?GENERAL:alert, no distress and comfortable ?SKIN: skin color, texture, turgor are normal, no rashes or significant lesions ?EYES: normal, Conjunctiva are pink and non-injected, sclera clear ?NEURO: alert & oriented x 3 with fluent speech, no focal motor/sensory deficits ?RECTAL: (+) mild skin  erythema around anal area with external hemorrhoids, (+) 2 areas of mild skin ulcers ? ?LABORATORY DATA:  ?I have reviewed the data as listed ? ?  Latest Ref Rng & Units 08/19/2021  ?  2:15 PM 08/13/2021  ?  3:28 PM 3

## 2021-08-20 ENCOUNTER — Ambulatory Visit: Payer: BLUE CROSS/BLUE SHIELD | Admitting: Hematology

## 2021-08-20 ENCOUNTER — Other Ambulatory Visit: Payer: BLUE CROSS/BLUE SHIELD

## 2021-08-20 ENCOUNTER — Ambulatory Visit
Admission: RE | Admit: 2021-08-20 | Discharge: 2021-08-20 | Disposition: A | Discharge status: Home / Self Care | Payer: BLUE CROSS/BLUE SHIELD | Source: Ambulatory Visit | Attending: Radiation Oncology | Admitting: Radiation Oncology

## 2021-08-20 ENCOUNTER — Encounter: Payer: Self-pay | Admitting: Hematology

## 2021-08-20 DIAGNOSIS — Z510 Encounter for antineoplastic radiation therapy: Secondary | ICD-10-CM | POA: Diagnosis not present

## 2021-08-21 ENCOUNTER — Ambulatory Visit
Admission: RE | Admit: 2021-08-21 | Discharge: 2021-08-21 | Disposition: A | Discharge status: Home / Self Care | Payer: BLUE CROSS/BLUE SHIELD | Source: Ambulatory Visit | Attending: Radiation Oncology | Admitting: Radiation Oncology

## 2021-08-21 DIAGNOSIS — Z510 Encounter for antineoplastic radiation therapy: Secondary | ICD-10-CM | POA: Diagnosis not present

## 2021-08-24 ENCOUNTER — Other Ambulatory Visit: Payer: Self-pay

## 2021-08-24 ENCOUNTER — Ambulatory Visit
Admission: RE | Admit: 2021-08-24 | Discharge: 2021-08-24 | Disposition: A | Discharge status: Home / Self Care | Payer: BLUE CROSS/BLUE SHIELD | Source: Ambulatory Visit | Attending: Radiation Oncology | Admitting: Radiation Oncology

## 2021-08-24 DIAGNOSIS — Z510 Encounter for antineoplastic radiation therapy: Secondary | ICD-10-CM | POA: Diagnosis not present

## 2021-08-25 ENCOUNTER — Ambulatory Visit
Admission: RE | Admit: 2021-08-25 | Discharge: 2021-08-25 | Disposition: A | Discharge status: Home / Self Care | Payer: BLUE CROSS/BLUE SHIELD | Source: Ambulatory Visit | Attending: Radiation Oncology | Admitting: Radiation Oncology

## 2021-08-25 ENCOUNTER — Other Ambulatory Visit: Payer: Self-pay

## 2021-08-25 DIAGNOSIS — Z510 Encounter for antineoplastic radiation therapy: Secondary | ICD-10-CM | POA: Diagnosis not present

## 2021-08-25 LAB — RAD ONC ARIA SESSION SUMMARY
Course Elapsed Days: 29
Plan Fractions Treated to Date: 22
Plan Prescribed Dose Per Fraction: 1.8 Gy
Plan Total Fractions Prescribed: 25
Plan Total Prescribed Dose: 45 Gy
Reference Point Dosage Given to Date: 39.6 Gy
Reference Point Session Dosage Given: 1.8 Gy
Session Number: 22

## 2021-08-25 NOTE — Therapy (Signed)
?OUTPATIENT PHYSICAL THERAPY FEMALE PELVIC EVALUATION ? ? ?Patient Name: Kayla Hobbs ?MRN: [id number] ?DOB:1970-10-06, 51 y.o., female ?Today's Date: 08/26/2021 ? ? PT End of Session - 08/26/21 1619   ? ? Visit Number 1   ? Date for PT Re-Evaluation 11/18/21   ? Authorization Type [employer]   ? PT Start Time 1619   ? PT Stop Time 7353   ? PT Time Calculation (min) 31 min   ? Activity Tolerance Patient tolerated treatment well   ? Behavior During Therapy [facility] for tasks assessed/performed   ? ?  ?  ? ?  ? ? ?Past Medical History:  ?Diagnosis Date  ? Arthritis   ? Hypertension   ? Skin cancer   ? basil top of heasd   ? ?Past Surgical History:  ?Procedure Laterality Date  ? PARTIAL HYSTERECTOMY  05/2020  ? due to heavy bleeding  ? SKIN CANCER EXCISION    ? ?Patient Active Problem List  ? Diagnosis Date Noted  ? Rectal cancer ([place]) 07/08/2021  ? Colon cancer screening 05/26/2021  ? Hematochezia 07/26/2019  ? Paresthesia 02/23/2019  ? ? ?PCP: Patient, No Pcp Per (Inactive) ? ?REFERRING PROVIDER: Truitt Merle, MD ? ?REFERRING DIAG: C20 (ICD-10-CM) - Rectal cancer ([place]) ? ?THERAPY DIAG:  ?Muscle weakness (generalized) ? ?Other muscle spasm ? ?ONSET DATE:  5 weeks ago with radiation ? ?SUBJECTIVE:                                                                                                                                                                                          ? ?SUBJECTIVE STATEMENT: ?Everything is bad right now.  One more week of radiation remaining in treatment ?Fluid intake: all the water I can 6-8 glasses ? ?Patient confirms identification and approves PT to assess pelvic floor and treatment Yes ? ? ?PAIN:  ?Are you having pain? Yes ?NPRS scale: 4/10 (after restroom or going to the restroom is 10+/10) ?Pain location: anus ? ?Pain type: burning, tight, and raw and stinging ? ? ?Aggravating factors: BM ?Relieving factors: sitz baths and creams and ointments ? ?PRECAUTIONS: Other: in radiation ? ?WEIGHT BEARING  RESTRICTIONS No ? ?FALLS:  ?Has patient fallen in last 6 months? No ? ?LIVING ENVIRONMENT: ?Lives with: lives with their spouse ? ? ?OCCUPATION: leave of absence; mail carrier ? ?PLOF: Independent ? ?PATIENT GOALS feel better and better pelvic floor function ? ?PERTINENT HISTORY:  ?Partial hysterectomy ?Sexual abuse: No ? ?BOWEL MOVEMENT ?Pain with bowel movement: Yes ?Type of bowel movement:Type (Bristol Stool Scale) 1-8 and Strain Yes ?Fully empty rectum: No ?Leakage: Yes: 1-2 but not a lot ? ? ?  URINATION ?Pain with urination: Yes ?Fully empty bladder: Yes:   ?Stream: Strong ?Urgency: No ?Frequency: going every our ?Leakage:  no ?Pads: No ? ?INTERCOURSE ?Pain with intercourse:  Yes ?Marinoff Scale: 3/3 ? ?PREGNANCY ?Vaginal deliveries 3 ?Tearing No ? ? ?OBJECTIVE:  ? ? ?COGNITION: ? Overall cognitive status: Within functional limits for tasks assessed   ? ? ?MUSCLE LENGTH: ?Hamstrings: Right 60 deg; Left 50 deg ? ? ? ?FUNCTIONAL TESTS:  ?SLS  ? ?GAIT: ? ?Comments: decreased step length ? ?POSTURE:  ?Thoracic kyphosis mild ? ?LUMBARAROM/PROM ? ?A/PROM A/PROM  ?08/26/2021  ?Flexion 60%  ?Extension WFL  ?Right lateral flexion WFL  ?Left lateral flexion WFL  ?Right rotation WFL  ?Left rotation WFL  ? (Blank rows = not tested) ?   ?Bil hip PROM - 75% limited throughout and painful  ? ?LE MMT: ? ?MMT Right ?08/26/2021 Left ?08/26/2021  ?Hip flexion    ?Hip extension    ?Hip abduction 4/5 4/5  ?Hip adduction 5/5 5/5  ?Hip internal rotation    ?Hip external rotation    ?Knee flexion    ?Knee extension    ?Ankle dorsiflexion    ?Ankle plantarflexion    ?Ankle inversion    ?Ankle eversion    ? ? ?      PALPATION: ?  General  lumbar and thoracic tight ? ?               ?              ?              Internal Pelvic Floor - deferred ? ? ? ?TODAY'S TREATMENT  ?Toileting and initial HEP educated and performed ? ? ?PATIENT EDUCATION:  ?Education details: Access Code: [email or web address] ?Person educated: Patient ?Education method: Explanation  and Demonstration ?Education comprehension: verbalized understanding and returned demonstration ? ? ?HOME EXERCISE PROGRAM: ?Access Code: [email or web address] ?URL: [email or web address] ?Date: 08/26/2021 ?Prepared by: Jari Favre ? ?Exercises ?- Supine Diaphragmatic Breathing  - 3 x daily - 7 x weekly - 1 sets - 10 reps ?- Supine Lower Trunk Rotation  - 1 x daily - 7 x weekly - 1 sets - 10 reps - 5 sec hold ?- Thoracic Extension with Noodle/Towel  - 1 x daily - 7 x weekly - 3 sets - 10 reps ? ?ASSESSMENT: ? ?CLINICAL IMPRESSION: ?Patient is a 51 y.o. female who was seen today for physical therapy evaluation and treatment for rectal cancer.  Pt has muscle spasms and weakness. Radiated tissues are too sore to do internal today.  Pt will benefit from skilled PT to address muscle coordination and further address pelvic floor directly as able. ? ? ?OBJECTIVE IMPAIRMENTS decreased activity tolerance, decreased coordination, decreased endurance, decreased ROM, decreased strength, increased fascial restrictions, increased muscle spasms, impaired flexibility, impaired tone, postural dysfunction, and pain.  ? ?ACTIVITY LIMITATIONS community activity, driving, occupation, and personal relationship, toileting .  ? ?PERSONAL FACTORS 1-2 comorbidities: vaginal deliveries, hysterectomy, rectal cancer with radiation and 3+ comorbidities:    are also affecting patient's functional outcome.  ? ? ?REHAB POTENTIAL: Excellent ? ?CLINICAL DECISION MAKING: Evolving/moderate complexity ? ?EVALUATION COMPLEXITY: Moderate ? ? ?GOALS: ?Goals reviewed with patient? Yes ? ?SHORT TERM GOALS: Target date: 09/23/2021 ? ?Ind with toileting techniques ?Baseline: ?Goal status: INITIAL ? ? ? ?LONG TERM GOALS: Target date: 11/18/2021 ? ?Pt will be independent with advanced HEP to maintain improvements made throughout therapy ? ?Baseline:  ?Goal status: INITIAL ? ?2.  Pt  will report 50% reduction of pain during BM to improvements in posture,  strength, and muscle length ? ?Baseline:  ?Goal status: INITIAL ? ?3.  Pt will be able to have a BM without straining ?Baseline:  ?Goal status: INITIAL ? ?4.  Pt will be able to fully empty bladder without pain  ?Baseline:  ?Goal status: INITIAL ? ? ? ?PLAN: ?PT FREQUENCY: 1x/week ? ?PT DURATION: 12 weeks ? ?PLANNED INTERVENTIONS: Therapeutic exercises, Therapeutic activity, Neuromuscular re-education, Balance training, Gait training, Patient/Family education, Joint mobilization, Dry Needling, Electrical stimulation, Cryotherapy, Moist heat, Biofeedback, and Manual therapy ? ?PLAN FOR NEXT SESSION: STM and dry needling thoracic and lumbar; breathing and stretches including cat cow and child pose; f/u on toileting, abdominal fascial release and perineal release as tolerated ? ? ?Jule Ser, PT ?08/26/2021, 5:21 PM ? ?

## 2021-08-26 ENCOUNTER — Other Ambulatory Visit: Payer: Self-pay

## 2021-08-26 ENCOUNTER — Ambulatory Visit: Payer: BLUE CROSS/BLUE SHIELD | Attending: Hematology | Admitting: Physical Therapy

## 2021-08-26 ENCOUNTER — Ambulatory Visit
Admission: RE | Admit: 2021-08-26 | Discharge: 2021-08-26 | Disposition: A | Discharge status: Home / Self Care | Payer: BLUE CROSS/BLUE SHIELD | Source: Ambulatory Visit | Attending: Radiation Oncology | Admitting: Radiation Oncology

## 2021-08-26 ENCOUNTER — Encounter: Payer: Self-pay | Admitting: Nurse Practitioner

## 2021-08-26 DIAGNOSIS — Z510 Encounter for antineoplastic radiation therapy: Secondary | ICD-10-CM | POA: Diagnosis not present

## 2021-08-26 DIAGNOSIS — M62838 Other muscle spasm: Secondary | ICD-10-CM | POA: Diagnosis not present

## 2021-08-26 DIAGNOSIS — C20 Malignant neoplasm of rectum: Secondary | ICD-10-CM | POA: Diagnosis present

## 2021-08-26 DIAGNOSIS — M6281 Muscle weakness (generalized): Secondary | ICD-10-CM | POA: Diagnosis not present

## 2021-08-26 LAB — RAD ONC ARIA SESSION SUMMARY
Course Elapsed Days: 30
Plan Fractions Treated to Date: 23
Plan Prescribed Dose Per Fraction: 1.8 Gy
Plan Total Fractions Prescribed: 25
Plan Total Prescribed Dose: 45 Gy
Reference Point Dosage Given to Date: 41.4 Gy
Reference Point Session Dosage Given: 1.8 Gy
Session Number: 23

## 2021-08-27 ENCOUNTER — Other Ambulatory Visit: Payer: Self-pay

## 2021-08-27 ENCOUNTER — Inpatient Hospital Stay: Payer: BLUE CROSS/BLUE SHIELD

## 2021-08-27 ENCOUNTER — Ambulatory Visit
Admission: RE | Admit: 2021-08-27 | Discharge: 2021-08-27 | Disposition: A | Discharge status: Home / Self Care | Payer: BLUE CROSS/BLUE SHIELD | Source: Ambulatory Visit | Attending: Radiation Oncology | Admitting: Radiation Oncology

## 2021-08-27 DIAGNOSIS — C20 Malignant neoplasm of rectum: Secondary | ICD-10-CM

## 2021-08-27 DIAGNOSIS — Z510 Encounter for antineoplastic radiation therapy: Secondary | ICD-10-CM | POA: Diagnosis not present

## 2021-08-27 LAB — RAD ONC ARIA SESSION SUMMARY
Course Elapsed Days: 31
Plan Fractions Treated to Date: 24
Plan Prescribed Dose Per Fraction: 1.8 Gy
Plan Total Fractions Prescribed: 25
Plan Total Prescribed Dose: 45 Gy
Reference Point Dosage Given to Date: 43.2 Gy
Reference Point Session Dosage Given: 1.8 Gy
Session Number: 24

## 2021-08-27 LAB — CBC WITH DIFFERENTIAL (CANCER CENTER ONLY)
Abs Immature Granulocytes: 0.01 10*3/uL (ref 0.00–0.07)
Basophils Absolute: 0 10*3/uL (ref 0.0–0.1)
Basophils Relative: 1 %
Eosinophils Absolute: 0.6 10*3/uL — ABNORMAL HIGH (ref 0.0–0.5)
Eosinophils Relative: 10 %
HCT: 37.7 % (ref 36.0–46.0)
Hemoglobin: 13 g/dL (ref 12.0–15.0)
Immature Granulocytes: 0 %
Lymphocytes Relative: 7 %
Lymphs Abs: 0.5 10*3/uL — ABNORMAL LOW (ref 0.7–4.0)
MCH: 31.5 pg (ref 26.0–34.0)
MCHC: 34.5 g/dL (ref 30.0–36.0)
MCV: 91.3 fL (ref 80.0–100.0)
Monocytes Absolute: 0.7 10*3/uL (ref 0.1–1.0)
Monocytes Relative: 11 %
Neutro Abs: 4.8 10*3/uL (ref 1.7–7.7)
Neutrophils Relative %: 71 %
Platelet Count: 198 10*3/uL (ref 150–400)
RBC: 4.13 MIL/uL (ref 3.87–5.11)
RDW: 15.5 % (ref 11.5–15.5)
WBC Count: 6.6 10*3/uL (ref 4.0–10.5)
nRBC: 0 % (ref 0.0–0.2)

## 2021-08-27 LAB — CMP (CANCER CENTER ONLY)
ALT: 15 U/L (ref 0–44)
AST: 18 U/L (ref 15–41)
Albumin: 4 g/dL (ref 3.5–5.0)
Alkaline Phosphatase: 70 U/L (ref 38–126)
Anion gap: 8 (ref 5–15)
BUN: 15 mg/dL (ref 6–20)
CO2: 29 mmol/L (ref 22–32)
Calcium: 9.2 mg/dL (ref 8.9–10.3)
Chloride: 103 mmol/L (ref 98–111)
Creatinine: 1.07 mg/dL — ABNORMAL HIGH (ref 0.44–1.00)
GFR, Estimated: >60 mL/min (ref >60)
Glucose, Bld: 84 mg/dL (ref 70–99)
Potassium: 2.9 mmol/L — ABNORMAL LOW (ref 3.5–5.1)
Sodium: 140 mmol/L (ref 135–145)
Total Bilirubin: 0.3 mg/dL (ref 0.3–1.2)
Total Protein: 6.9 g/dL (ref 6.5–8.1)

## 2021-08-28 ENCOUNTER — Other Ambulatory Visit: Payer: Self-pay

## 2021-08-28 ENCOUNTER — Ambulatory Visit
Admission: RE | Admit: 2021-08-28 | Discharge: 2021-08-28 | Disposition: A | Discharge status: Home / Self Care | Payer: BLUE CROSS/BLUE SHIELD | Source: Ambulatory Visit | Attending: Radiation Oncology | Admitting: Radiation Oncology

## 2021-08-28 DIAGNOSIS — Z510 Encounter for antineoplastic radiation therapy: Secondary | ICD-10-CM | POA: Diagnosis not present

## 2021-08-28 LAB — RAD ONC ARIA SESSION SUMMARY
Course Elapsed Days: 32
Plan Fractions Treated to Date: 25
Plan Prescribed Dose Per Fraction: 1.8 Gy
Plan Total Fractions Prescribed: 25
Plan Total Prescribed Dose: 45 Gy
Reference Point Dosage Given to Date: 45 Gy
Reference Point Session Dosage Given: 1.8 Gy
Session Number: 25

## 2021-08-31 ENCOUNTER — Encounter: Payer: Self-pay | Admitting: Nurse Practitioner

## 2021-08-31 ENCOUNTER — Other Ambulatory Visit: Payer: Self-pay | Admitting: Hematology

## 2021-08-31 ENCOUNTER — Other Ambulatory Visit: Payer: Self-pay

## 2021-08-31 ENCOUNTER — Ambulatory Visit
Admission: RE | Admit: 2021-08-31 | Discharge: 2021-08-31 | Disposition: A | Discharge status: Home / Self Care | Payer: BLUE CROSS/BLUE SHIELD | Source: Ambulatory Visit | Attending: Radiation Oncology | Admitting: Radiation Oncology

## 2021-08-31 DIAGNOSIS — Z510 Encounter for antineoplastic radiation therapy: Secondary | ICD-10-CM | POA: Diagnosis not present

## 2021-08-31 LAB — RAD ONC ARIA SESSION SUMMARY
Course Elapsed Days: 35
Plan Fractions Treated to Date: 1
Plan Prescribed Dose Per Fraction: 1.8 Gy
Plan Total Fractions Prescribed: 3
Plan Total Prescribed Dose: 5.4 Gy
Reference Point Dosage Given to Date: 46.8 Gy
Reference Point Session Dosage Given: 1.8 Gy
Session Number: 26

## 2021-09-01 ENCOUNTER — Inpatient Hospital Stay: Payer: BLUE CROSS/BLUE SHIELD

## 2021-09-01 ENCOUNTER — Other Ambulatory Visit: Payer: Self-pay

## 2021-09-01 ENCOUNTER — Encounter: Payer: Self-pay | Admitting: Nurse Practitioner

## 2021-09-01 ENCOUNTER — Inpatient Hospital Stay (HOSPITAL_BASED_OUTPATIENT_CLINIC_OR_DEPARTMENT_OTHER): Payer: BLUE CROSS/BLUE SHIELD | Admitting: Nurse Practitioner

## 2021-09-01 ENCOUNTER — Ambulatory Visit
Admission: RE | Admit: 2021-09-01 | Discharge: 2021-09-01 | Disposition: A | Discharge status: Home / Self Care | Payer: BLUE CROSS/BLUE SHIELD | Source: Ambulatory Visit | Attending: Radiation Oncology | Admitting: Radiation Oncology

## 2021-09-01 VITALS — BP 113/91 | HR 81 | Temp 97.8°F | Resp 16 | Wt 156.8 lb

## 2021-09-01 DIAGNOSIS — E876 Hypokalemia: Secondary | ICD-10-CM

## 2021-09-01 DIAGNOSIS — C20 Malignant neoplasm of rectum: Secondary | ICD-10-CM

## 2021-09-01 DIAGNOSIS — Z510 Encounter for antineoplastic radiation therapy: Secondary | ICD-10-CM | POA: Diagnosis not present

## 2021-09-01 LAB — RAD ONC ARIA SESSION SUMMARY
Course Elapsed Days: 36
Plan Fractions Treated to Date: 2
Plan Prescribed Dose Per Fraction: 1.8 Gy
Plan Total Fractions Prescribed: 3
Plan Total Prescribed Dose: 5.4 Gy
Reference Point Dosage Given to Date: 48.6 Gy
Reference Point Session Dosage Given: 1.8 Gy
Session Number: 27

## 2021-09-01 LAB — CMP (CANCER CENTER ONLY)
ALT: 9 U/L (ref 0–44)
AST: 13 U/L — ABNORMAL LOW (ref 15–41)
Albumin: 3.9 g/dL (ref 3.5–5.0)
Alkaline Phosphatase: 73 U/L (ref 38–126)
Anion gap: 3 — ABNORMAL LOW (ref 5–15)
BUN: 12 mg/dL (ref 6–20)
CO2: 33 mmol/L — ABNORMAL HIGH (ref 22–32)
Calcium: 9.1 mg/dL (ref 8.9–10.3)
Chloride: 104 mmol/L (ref 98–111)
Creatinine: 0.99 mg/dL (ref 0.44–1.00)
GFR, Estimated: >60 mL/min (ref >60)
Glucose, Bld: 89 mg/dL (ref 70–99)
Potassium: 3 mmol/L — ABNORMAL LOW (ref 3.5–5.1)
Sodium: 140 mmol/L (ref 135–145)
Total Bilirubin: 0.6 mg/dL (ref 0.3–1.2)
Total Protein: 6.5 g/dL (ref 6.5–8.1)

## 2021-09-01 LAB — CBC WITH DIFFERENTIAL (CANCER CENTER ONLY)
Abs Immature Granulocytes: 0.01 10*3/uL (ref 0.00–0.07)
Basophils Absolute: 0 10*3/uL (ref 0.0–0.1)
Basophils Relative: 0 %
Eosinophils Absolute: 0.6 10*3/uL — ABNORMAL HIGH (ref 0.0–0.5)
Eosinophils Relative: 12 %
HCT: 38.7 % (ref 36.0–46.0)
Hemoglobin: 13.3 g/dL (ref 12.0–15.0)
Immature Granulocytes: 0 %
Lymphocytes Relative: 8 %
Lymphs Abs: 0.4 10*3/uL — ABNORMAL LOW (ref 0.7–4.0)
MCH: 31.6 pg (ref 26.0–34.0)
MCHC: 34.4 g/dL (ref 30.0–36.0)
MCV: 91.9 fL (ref 80.0–100.0)
Monocytes Absolute: 0.7 10*3/uL (ref 0.1–1.0)
Monocytes Relative: 14 %
Neutro Abs: 3.2 10*3/uL (ref 1.7–7.7)
Neutrophils Relative %: 66 %
Platelet Count: 195 10*3/uL (ref 150–400)
RBC: 4.21 MIL/uL (ref 3.87–5.11)
RDW: 16.2 % — ABNORMAL HIGH (ref 11.5–15.5)
WBC Count: 4.9 10*3/uL (ref 4.0–10.5)
nRBC: 0 % (ref 0.0–0.2)

## 2021-09-01 MED ORDER — POTASSIUM CHLORIDE 10 MEQ/100ML IV SOLN
10.0000 meq | INTRAVENOUS | Status: AC
  Administered 2021-09-01 (×2): 10 meq via INTRAVENOUS

## 2021-09-01 MED ORDER — OXYCODONE HCL 5 MG PO TABS: 5.0000 mg | ORAL_TABLET | ORAL | 0 refills | Status: DC | PRN

## 2021-09-01 MED ORDER — POTASSIUM CHLORIDE 10 MEQ/100ML IV SOLN
10.0000 meq | INTRAVENOUS | Status: AC
  Filled 2021-09-01: qty 100

## 2021-09-01 MED ORDER — SODIUM CHLORIDE 0.9 % IV SOLN: Freq: Once | INTRAVENOUS | Status: AC

## 2021-09-01 NOTE — Patient Instructions (Signed)
Potassium Chloride Injection What is this medication? POTASSIUM CHLORIDE (poe TASS i um KLOOR ide) prevents and treats low levels of potassium in your body. Potassium plays an important role in maintaining the health of your kidneys, heart, muscles, and nervous system. This medicine may be used for other purposes; ask your health care provider or pharmacist if you have questions. COMMON BRAND NAME(S): PROAMP What should I tell my care team before I take this medication? They need to know if you have any of these conditions: Addison disease Dehydration Diabetes (high blood sugar) Heart disease High levels of potassium in the blood Irregular heartbeat or rhythm Kidney disease Large areas of burned skin An unusual or allergic reaction to potassium, other medications, foods, dyes, or preservatives Pregnant or trying to get pregnant Breast-feeding How should I use this medication? This medication is injected into a vein. It is given in a hospital or clinic setting. Talk to your care team about the use of this medication in children. Special care may be needed. Overdosage: If you think you have taken too much of this medicine contact a poison control center or emergency room at once. NOTE: This medicine is only for you. Do not share this medicine with others. What if I miss a dose? This does not apply. This medication is not for regular use. What may interact with this medication? Do not take this medication with any of the following: Certain diuretics such as spironolactone, triamterene Eplerenone Sodium polystyrene sulfonate This medication may also interact with the following: Certain medications for blood pressure or heart disease like lisinopril, losartan, quinapril, valsartan Medications that lower your chance of fighting infection such as cyclosporine, tacrolimus NSAIDs, medications for pain and inflammation, like ibuprofen or naproxen Other potassium supplements Salt  substitutes This list may not describe all possible interactions. Give your health care provider a list of all the medicines, herbs, non-prescription drugs, or dietary supplements you use. Also tell them if you smoke, drink alcohol, or use illegal drugs. Some items may interact with your medicine. What should I watch for while using this medication? Visit your care team for regular checks on your progress. Tell your care team if your symptoms do not start to get better or if they get worse. You may need blood work while you are taking this medication. Avoid salt substitutes unless you are told otherwise by your care team. What side effects may I notice from receiving this medication? Side effects that you should report to your care team as soon as possible: Allergic reactions--skin rash, itching, hives, swelling of the face, lips, tongue, or throat High potassium level--muscle weakness, fast or irregular heartbeat Side effects that usually do not require medical attention (report to your care team if they continue or are bothersome): Diarrhea Nausea Stomach pain Vomiting This list may not describe all possible side effects. Call your doctor for medical advice about side effects. You may report side effects to FDA at 1-800-FDA-1088. Where should I keep my medication? This medication is given in a hospital or clinic. It will not be stored at home. NOTE: This sheet is a summary. It may not cover all possible information. If you have questions about this medicine, talk to your doctor, pharmacist, or health care provider.  2023 Elsevier/Gold Standard (2020-08-12 00:00:00)  

## 2021-09-01 NOTE — Progress Notes (Signed)
Approximately 10 minutes into potassium infusion, patient complained of burning at IV site. Infusion was paused and blood return noted/IV site flushed with no issue. Infusion resumed with normal saline running at 295m/hr to dilute. Heat pack applied as well, patient reported burning sensation subsided and was instructed to notify RN if it returns.  ?

## 2021-09-01 NOTE — Progress Notes (Signed)
?[place]   ?[phone] [phone]   ?Clinic Follow up Note  ? ?Patient Care Team: ?Patient, No Pcp Per (Inactive) as PCP - General (General Practice) ?Leighton Ruff, MD as Consulting Physician (General Surgery) ?Truitt Merle, MD as Consulting Physician (Hematology) ?Earl Gala, Deliah Goody, RN as Sales executive (Oncology) ?Alla Feeling, NP as Nurse Practitioner (Nurse Practitioner) ?09/01/2021 ? ?CHIEF COMPLAINT: Rectal cancer  ? ?SUMMARY OF ONCOLOGIC HISTORY: ?Oncology History  ?Rectal cancer ([place])  ?06/18/2021 Cancer Staging  ? Staging form: Colon and Rectum, AJCC 8th Edition ?- Clinical stage from 06/18/2021: Stage I (cT1, cN0, cM0) - Signed by Truitt Merle, MD on 07/11/2021 ?Stage prefix: Initial diagnosis ?Total positive nodes: 0 ?Histologic grade (G): G2 ?Histologic grading system: 4 grade system ? ?  ?06/18/2021 Procedure  ? Colonoscopy per Dr. Havery [name], impression ?- Polypoid lesion found on digital rectal exam. ?- One diminutive polyp in the ascending colon, removed with a cold biopsy forceps. Resected and retrieved. ?- One 5 mm polyp in the sigmoid colon, removed with a cold snare. Resected and retrieved. ?- One large polyp in the distal rectum. Resected and retrieved as outlined. Injected/tattooed. ?Clips were placed. ?- Polypoid lesions in the distal rectum. Biopsied. ?- Internal hemorrhoids. ?- The examination was otherwise normal. ?  ?06/18/2021 Initial Biopsy  ? Diagnosis ?1. Surgical [P], colon, distal rectal biopsy ?- CONDYLOMA ACUMINATA ?2. Surgical [P], colon, ascending, sigmoid, polyp (2) ?- TUBULAR ADENOMA (2 OF 2 FRAGMENTS) ?- NO HIGH-GRADE DYSPLASIA OR MALIGNANCY IDENTIFIED ?3. Surgical [P], colon, rectum, polyp (1) ?- ADENOCARCINOMA ARISING IN A TUBULAR ADENOMA WITH HIGH-GRADE DYSPLASIA ?- SEE COMMENT ?Microscopic Comment ?3. The invasive adenocarcinoma appears moderately differentiated and invades the submucosa for a depth of 0.8 cm. The adenocarcinoma comes  within 0.1 cm from the base of the polyp. High-grade features including lymphovascular space invasion, tumor budding or poor differentiation are not identified. Dr. Vic Ripper reviewed the case and agrees ?with the above diagnosis. ?  ?07/02/2021 Imaging  ? CT CAP IMPRESSION: ?Negative. No evidence of metastatic disease or other significant abnormality. ?  ?07/03/2021 Imaging  ? Pelvic MRI w/wo contrast IMPRESSION: ?Rectal adenocarcinoma T stage: T2.  Lesion not identified  ?Rectal adenocarcinoma N stage:  N0  ?Distance from tumor to the internal anal sphincter. Lesion not identified ?  ?07/08/2021 Initial Diagnosis  ? Rectal cancer ([place]) ? ?  ? ? ?CURRENT THERAPY: Concurrent chemoRT with Xeloda 1500 mg BID M-F, starting 07/27/21 ? ?INTERVAL HISTORY: Ms. Delcid returns for follow up as scheduled. Last seen 4/12. Due to rectal pain and mild to moderate radiation dermatitis, she was prescribed tylenol #3. She sent our office a message 4/24 reporting severe rectal pain especially with bathroom. Inside vagina is red, itches, and burns. She cries at the thought of another treatment. ? ?Today she presents with her spouse, she is tearful during today's encounter. She only took tylenol #3 a few times as it did not help.  Her pain is "okay" just sitting but increases to 10 out of 10 with BM and can take an hour to calm down after.  She has mostly loose stools after eating, amount varies.  Denies blood in her stool.  She also has burning when urine comes out of the urethra.  She had itching a few nights ago and used yeast cream, itching improved.  She has some rectal and vaginal discharge.  Denies fever or chills.  She continues topical creams and sitz bath's.  She is tolerating Xeloda,  denies hand-foot redness or sensitivity, skin change, nausea/vomiting.  She is eating and drinking well. ? ?All other systems were reviewed with the patient and are negative. ? ?MEDICAL HISTORY:  ?Past Medical History:  ?Diagnosis Date  ? Arthritis    ? Hypertension   ? Skin cancer   ? basil top of heasd   ? ? ?SURGICAL HISTORY: ?Past Surgical History:  ?Procedure Laterality Date  ? PARTIAL HYSTERECTOMY  05/2020  ? due to heavy bleeding  ? SKIN CANCER EXCISION    ? ? ?I have reviewed the social history and family history with the patient and they are unchanged from previous note. ? ?ALLERGIES:  has No Known Allergies. ? ?MEDICATIONS:  ?Current Outpatient Medications  ?Medication Sig Dispense Refill  ? acetaminophen-codeine (TYLENOL #3) 300-30 MG tablet Take 1 tablet by mouth every 6 (six) hours as needed for moderate pain. 20 tablet 0  ? buPROPion (WELLBUTRIN SR) 150 MG 12 hr tablet Take 150 mg by mouth 2 (two) times daily.    ? busPIRone (BUSPAR) 10 MG tablet Take 10 mg by mouth 2 (two) times daily.    ? capecitabine (XELODA) 500 MG tablet TAKE 3 TABLETS BY MOUTH 2 TIMES DAILY AFTER A MEAL ON DAYS OF RADIATION, MONDAY THROUGH FRIDAY. 90 tablet 0  ? estradiol (ESTRACE) 0.5 MG tablet Take 0.5 mg by mouth daily.    ? fluticasone (FLONASE) 50 MCG/ACT nasal spray Place 2 sprays into both nostrils daily. 16 g 0  ? hydrochlorothiazide (HYDRODIURIL) 50 MG tablet Take 50 mg by mouth daily.    ? KLOR-CON M20 20 MEQ tablet TAKE 1 TABLET BY MOUTH TWICE A DAY 30 tablet 1  ? oxyCODONE (OXY IR/ROXICODONE) 5 MG immediate release tablet Take 1 tablet (5 mg total) by mouth every 4 (four) hours as needed for severe pain. 20 tablet 0  ? ondansetron (ZOFRAN) 8 MG tablet Take 1 tablet (8 mg total) by mouth every 8 (eight) hours as needed for nausea or vomiting. 15 tablet 0  ? SUMAtriptan (IMITREX) 50 MG tablet Take 50 mg by mouth daily as needed. (Patient not taking: Reported on 07/08/2021)    ? ?Current Facility-Administered Medications  ?Medication Dose Route Frequency Provider Last Rate Last Admin  ? potassium chloride 10 mEq in 100 mL IVPB  10 mEq Intravenous Q1 Hr x 2 Alla Feeling, NP      ? ? ?PHYSICAL EXAMINATION: ?ECOG PERFORMANCE STATUS: 1 - Symptomatic but completely  ambulatory ? ?Vitals:  ? 09/01/21 1006  ?BP: (!) 113/91  ?Pulse: 81  ?Resp: 16  ?Temp: 97.8 ?F (36.6 ?C)  ?SpO2: 100%  ? ?Filed Weights  ? 09/01/21 1006  ?Weight: 156 lb 12.8 oz (71.1 kg)  ? ? ?GENERAL:alert, no distress and comfortable ?SKIN: No rash.  Palms without erythema ?EYES:  sclera clear ?LUNGS: normal breathing effort ?HEART: no lower extremity edema ?NEURO: alert & oriented x 3 with fluent speech ?Rectal: Moderate perineal erythema without ulceration with more severe erythema at the inner labia.  Vaginal and rectal discharge present ? ?LABORATORY DATA:  ?I have reviewed the data as listed ? ?  Latest Ref Rng & Units 09/01/2021  ?  9:49 AM 08/27/2021  ?  3:43 PM 08/19/2021  ?  2:15 PM  ?CBC  ?WBC 4.0 - 10.5 K/uL 4.9   6.6   4.4    ?Hemoglobin 12.0 - 15.0 g/dL 13.3   13.0   13.4    ?Hematocrit 36.0 - 46.0 % 38.7  37.7   39.9    ?Platelets 150 - 400 K/uL 195   198   191    ? ? ? ? ?  Latest Ref Rng & Units 09/01/2021  ?  9:49 AM 08/27/2021  ?  3:43 PM 08/19/2021  ?  2:15 PM  ?CMP  ?Glucose 70 - 99 mg/dL 89   84   95    ?BUN 6 - 20 mg/dL '12   15   13    '$ ?Creatinine 0.44 - 1.00 mg/dL 0.99   1.07   0.88    ?Sodium 135 - 145 mmol/L 140   140   138    ?Potassium 3.5 - 5.1 mmol/L 3.0   2.9   2.9    ?Chloride 98 - 111 mmol/L 104   103   101    ?CO2 22 - 32 mmol/L 33   29   30    ?Calcium 8.9 - 10.3 mg/dL 9.1   9.2   9.4    ?Total Protein 6.5 - 8.1 g/dL 6.5   6.9   7.0    ?Total Bilirubin 0.3 - 1.2 mg/dL 0.6   0.3   0.6    ?Alkaline Phos 38 - 126 U/L 73   70   64    ?AST 15 - 41 U/L '13   18   16    '$ ?ALT 0 - 44 U/L '9   15   14    '$ ? ? ? ? ?RADIOGRAPHIC STUDIES: ?I have personally reviewed the radiological images as listed and agreed with the findings in the report. ?No results found.  ? ?ASSESSMENT & PLAN: 51 year old female ?  ?Moderately differentiated rectal adenocarcinoma, G2, T1N0M0 stage I ?-She presented with rectal bleeding in 07/2019, colonoscopy was canceled by the patient. She had persistent bleeding and  presented back to GI 06/2021, colonoscopy showed a large 3 cm pedunculated polyp on a stalk at the distal rectum within 2-3 cm of the dentate line s/p endoscopic resection ?-Rectal polyp path showed moderately diff

## 2021-09-02 ENCOUNTER — Ambulatory Visit
Admission: RE | Admit: 2021-09-02 | Discharge: 2021-09-02 | Disposition: A | Discharge status: Home / Self Care | Payer: BLUE CROSS/BLUE SHIELD | Source: Ambulatory Visit | Attending: Radiation Oncology | Admitting: Radiation Oncology

## 2021-09-02 ENCOUNTER — Ambulatory Visit: Payer: Self-pay | Admitting: Radiation Oncology

## 2021-09-02 ENCOUNTER — Telehealth: Payer: Self-pay | Admitting: Hematology

## 2021-09-02 ENCOUNTER — Other Ambulatory Visit: Payer: Self-pay

## 2021-09-02 DIAGNOSIS — C20 Malignant neoplasm of rectum: Secondary | ICD-10-CM

## 2021-09-02 DIAGNOSIS — Z510 Encounter for antineoplastic radiation therapy: Secondary | ICD-10-CM | POA: Diagnosis not present

## 2021-09-02 LAB — RAD ONC ARIA SESSION SUMMARY
Course Elapsed Days: 37
Plan Fractions Treated to Date: 3
Plan Prescribed Dose Per Fraction: 1.8 Gy
Plan Total Fractions Prescribed: 3
Plan Total Prescribed Dose: 5.4 Gy
Reference Point Dosage Given to Date: 50.4 Gy
Reference Point Session Dosage Given: 1.8 Gy
Session Number: 28

## 2021-09-02 NOTE — Telephone Encounter (Signed)
Scheduled follow-up appointment per 4/25 los. Patient is aware. ?

## 2021-09-04 ENCOUNTER — Other Ambulatory Visit: Payer: Self-pay

## 2021-09-14 ENCOUNTER — Telehealth: Payer: Self-pay

## 2021-09-14 NOTE — Telephone Encounter (Signed)
-----   Message from Yetta Flock, MD sent at 09/13/2021  2:35 PM EDT ----- ?Regarding: RE: chart check ?Thanks Jan, ?Maybe best to book a routine follow up in the office. She is going through XRT, chemo. Thanks ? ?----- Message ----- ?From: Roetta Sessions, CMA ?Sent: 09/09/2021  11:03 AM EDT ?To: Yetta Flock, MD ?Subject: chart check                                   ? ?Dr. Havery [name] you asked me to send you a chart check reminder for this patient re: possible flex Sig in June ? ?Thanks, ?Jan ? ? ?

## 2021-09-14 NOTE — Telephone Encounter (Signed)
Called and spoke to patient.  Scheduled her for OV with Dr. Havery [name] on 6-6.  ?

## 2021-09-16 ENCOUNTER — Other Ambulatory Visit: Payer: Self-pay | Admitting: Hematology

## 2021-09-17 ENCOUNTER — Other Ambulatory Visit: Payer: Self-pay

## 2021-09-17 ENCOUNTER — Encounter: Payer: Self-pay | Admitting: Physical Therapy

## 2021-09-17 ENCOUNTER — Ambulatory Visit: Payer: BLUE CROSS/BLUE SHIELD | Attending: Hematology | Admitting: Physical Therapy

## 2021-09-17 ENCOUNTER — Inpatient Hospital Stay: Payer: BLUE CROSS/BLUE SHIELD | Attending: Nurse Practitioner | Admitting: Hematology

## 2021-09-17 ENCOUNTER — Inpatient Hospital Stay: Payer: BLUE CROSS/BLUE SHIELD

## 2021-09-17 ENCOUNTER — Encounter: Payer: Self-pay | Admitting: Hematology

## 2021-09-17 VITALS — BP 128/92 | HR 82 | Temp 98.5°F | Resp 18 | Ht 64.0 in | Wt 157.0 lb

## 2021-09-17 DIAGNOSIS — M6281 Muscle weakness (generalized): Secondary | ICD-10-CM | POA: Insufficient documentation

## 2021-09-17 DIAGNOSIS — I10 Essential (primary) hypertension: Secondary | ICD-10-CM | POA: Insufficient documentation

## 2021-09-17 DIAGNOSIS — F419 Anxiety disorder, unspecified: Secondary | ICD-10-CM | POA: Insufficient documentation

## 2021-09-17 DIAGNOSIS — C20 Malignant neoplasm of rectum: Secondary | ICD-10-CM

## 2021-09-17 DIAGNOSIS — Z87891 Personal history of nicotine dependence: Secondary | ICD-10-CM | POA: Diagnosis not present

## 2021-09-17 DIAGNOSIS — Z79899 Other long term (current) drug therapy: Secondary | ICD-10-CM | POA: Insufficient documentation

## 2021-09-17 DIAGNOSIS — M62838 Other muscle spasm: Secondary | ICD-10-CM | POA: Insufficient documentation

## 2021-09-17 LAB — CMP (CANCER CENTER ONLY)
ALT: 9 U/L (ref 0–44)
AST: 12 U/L — ABNORMAL LOW (ref 15–41)
Albumin: 3.9 g/dL (ref 3.5–5.0)
Alkaline Phosphatase: 79 U/L (ref 38–126)
Anion gap: 6 (ref 5–15)
BUN: 13 mg/dL (ref 6–20)
CO2: 34 mmol/L — ABNORMAL HIGH (ref 22–32)
Calcium: 9.2 mg/dL (ref 8.9–10.3)
Chloride: 100 mmol/L (ref 98–111)
Creatinine: 0.98 mg/dL (ref 0.44–1.00)
GFR, Estimated: >60 mL/min (ref >60)
Glucose, Bld: 104 mg/dL — ABNORMAL HIGH (ref 70–99)
Potassium: 2.8 mmol/L — ABNORMAL LOW (ref 3.5–5.1)
Sodium: 140 mmol/L (ref 135–145)
Total Bilirubin: 0.5 mg/dL (ref 0.3–1.2)
Total Protein: 6.8 g/dL (ref 6.5–8.1)

## 2021-09-17 LAB — CBC WITH DIFFERENTIAL (CANCER CENTER ONLY)
Abs Immature Granulocytes: 0.01 10*3/uL (ref 0.00–0.07)
Basophils Absolute: 0 10*3/uL (ref 0.0–0.1)
Basophils Relative: 1 %
Eosinophils Absolute: 0.1 10*3/uL (ref 0.0–0.5)
Eosinophils Relative: 3 %
HCT: 37.7 % (ref 36.0–46.0)
Hemoglobin: 13.4 g/dL (ref 12.0–15.0)
Immature Granulocytes: 0 %
Lymphocytes Relative: 12 %
Lymphs Abs: 0.5 10*3/uL — ABNORMAL LOW (ref 0.7–4.0)
MCH: 32.8 pg (ref 26.0–34.0)
MCHC: 35.5 g/dL (ref 30.0–36.0)
MCV: 92.2 fL (ref 80.0–100.0)
Monocytes Absolute: 0.6 10*3/uL (ref 0.1–1.0)
Monocytes Relative: 15 %
Neutro Abs: 2.8 10*3/uL (ref 1.7–7.7)
Neutrophils Relative %: 69 %
Platelet Count: 177 10*3/uL (ref 150–400)
RBC: 4.09 MIL/uL (ref 3.87–5.11)
RDW: 17.7 % — ABNORMAL HIGH (ref 11.5–15.5)
WBC Count: 4.1 10*3/uL (ref 4.0–10.5)
nRBC: 0 % (ref 0.0–0.2)

## 2021-09-17 NOTE — Progress Notes (Signed)
?[place]   ?[phone] [phone]   ?Clinic Follow up Note  ? ?Patient Care Team: ?Patient, No Pcp Per (Inactive) as PCP - General (General Practice) ?Kayla Ruff, MD as Consulting Physician (General Surgery) ?Kayla Merle, MD as Consulting Physician (Hematology) ?Kayla Hobbs, Kayla Goody, RN as Sales executive (Oncology) ?Kayla Feeling, NP as Nurse Practitioner (Nurse Practitioner) ? ?Date of Service:  09/17/2021 ? ?CHIEF COMPLAINT: f/u of rectal cancer ? ?CURRENT THERAPY:  ?Surveillance ? ?ASSESSMENT & PLAN:  ?Kayla Hobbs is a 51 y.o. female with  ? ?1. Moderately differentiated rectal adenocarcinoma, G2, cT2N0M0 stage I ?-presented with rectal bleeding in 07/2019, but patient cancelled colonoscopy at that time. She had persistent bleeding and presented back to GI 06/2021, work up showed a distal rectal polyp palpable on DRE. Colonoscopy on 06/18/21 by Kayla Hobbs showed a ~3 cm polyp, path revealed moderately differentiated adenocarcinoma invading submucosa to a dept of 8 mm, with a close (0.1 cm) but negative margin.   ?-Staging CT CAP on 07/02/21 was negative for local regional adenopathy or distant metastasis. ?-Staging pelvic MRI on 07/03/21 showed local inflammation but no identifiable lesion. ?-MMR testing on biopsy was normal. ?-she received concurrent chemoRT 07/15/21 - 09/02/21 with Xeloda 1500 mg BID and radiation under Dr. Lisbeth Hobbs. She tolerated treatment well with no significant side effects, except local irritation. ?--I discussed the risk of cancer recurrence in the future. I discussed the surveillance plan, which is a physical exam and lab test (including CBC, CMP and CEA) every 3-4 months for the first 2 years, then every 6-12 months, colonoscopy in one year, and surveilliance CT scan every 12 month for 2-3 years.  ? ? ?2. Anxiety, Stress ?-she reports worsening memory and anxiety since diagnosis. ?-I discussed we have a counselor, Dr. Michail Hobbs, that I could  refer her to if she would like. ?-I will have our social worker reach out to her. ?  ?3. HTN ?-on HCTZ  ?  ?4. Substance use ?-she started Welbutrin to quit smoking, managed to quit 5 years ago. She continues welbutrin, as well as Buspar ?-she drinks wine nightly  ?-she was prescribed oxycodone on 09/01/21 for severe rectal pain. ?  ?5. Genetics ?-patient had genetic testing [facility], reportedly negative.  We will request the report ?-Patient has 3 healthy daughters, oldest is 6.  Recommendation is for them to begin colorectal cancer screening at age 69 (49 years younger than patient at her diagnosis), or sooner if they have for personal red flags ?  ?  ?PLAN: ?-lab and f/u in 2 months, she will follow-up with Kayla Hobbs in 4 months ?-Social worker referral for counseling of anxiety  ? ? ?No problem-specific Assessment & Plan notes found for this encounter. ? ? ?SUMMARY OF ONCOLOGIC HISTORY: ?Oncology History  ?Rectal cancer ([place])  ?06/18/2021 Cancer Staging  ? Staging form: Colon and Rectum, AJCC 8th Edition ?- Clinical stage from 06/18/2021: Stage I (cT1, cN0, cM0) - Signed by Kayla Merle, MD on 07/11/2021 ?Stage prefix: Initial diagnosis ?Total positive nodes: 0 ?Histologic grade (G): G2 ?Histologic grading system: 4 grade system ? ?  ?06/18/2021 Procedure  ? Colonoscopy per Kayla Hobbs, impression ?- Polypoid lesion found on digital rectal exam. ?- One diminutive polyp in the ascending colon, removed with a cold biopsy forceps. Resected and retrieved. ?- One 5 mm polyp in the sigmoid colon, removed with a cold snare. Resected and retrieved. ?- One large polyp in the  distal rectum. Resected and retrieved as outlined. Injected/tattooed. ?Clips were placed. ?- Polypoid lesions in the distal rectum. Biopsied. ?- Internal hemorrhoids. ?- The examination was otherwise normal. ?  ?06/18/2021 Initial Biopsy  ? Diagnosis ?1. Surgical [P], colon, distal rectal biopsy ?- CONDYLOMA ACUMINATA ?2.  Surgical [P], colon, ascending, sigmoid, polyp (2) ?- TUBULAR ADENOMA (2 OF 2 FRAGMENTS) ?- NO HIGH-GRADE DYSPLASIA OR MALIGNANCY IDENTIFIED ?3. Surgical [P], colon, rectum, polyp (1) ?- ADENOCARCINOMA ARISING IN A TUBULAR ADENOMA WITH HIGH-GRADE DYSPLASIA ?- SEE COMMENT ?Microscopic Comment ?3. The invasive adenocarcinoma appears moderately differentiated and invades the submucosa for a depth of 0.8 cm. The adenocarcinoma comes within 0.1 cm from the base of the polyp. High-grade features including lymphovascular space invasion, tumor budding or poor differentiation are not identified. Kayla Hobbs reviewed the case and agrees ?with the above diagnosis. ?  ?07/02/2021 Imaging  ? CT CAP IMPRESSION: ?Negative. No evidence of metastatic disease or other significant abnormality. ?  ?07/03/2021 Imaging  ? Pelvic MRI w/wo contrast IMPRESSION: ?Rectal adenocarcinoma T stage: T2.  Lesion not identified  ?Rectal adenocarcinoma N stage:  N0  ?Distance from tumor to the internal anal sphincter. Lesion not identified ?  ?07/08/2021 Initial Diagnosis  ? Rectal cancer ([place]) ? ?  ? ? ? ?INTERVAL HISTORY:  ?Kayla Hobbs is here for a follow up of rectal cancer. She was last seen by NP Kayla Hobbs on 09/01/21. She presents to the clinic alone. ?She reports her skin is recovering from radiation. She reports she is having maybe 2 bowel movements a day. ?She notes some intermittent anxiety-- she explains some days are good but other days she just cries. She became tearful during our visit today. ?  ?All other systems were reviewed with the patient and are negative. ? ?MEDICAL HISTORY:  ?Past Medical History:  ?Diagnosis Date  ? Arthritis   ? Hypertension   ? Skin cancer   ? basil top of heasd   ? ? ?SURGICAL HISTORY: ?Past Surgical History:  ?Procedure Laterality Date  ? PARTIAL HYSTERECTOMY  05/2020  ? due to heavy bleeding  ? SKIN CANCER EXCISION    ? ? ?I have reviewed the social history and family history with the patient and they are  unchanged from previous note. ? ?ALLERGIES:  has No Known Allergies. ? ?MEDICATIONS:  ?Current Outpatient Medications  ?Medication Sig Dispense Refill  ? acetaminophen-codeine (TYLENOL #3) 300-30 MG tablet Take 1 tablet by mouth every 6 (six) hours as needed for moderate pain. 20 tablet 0  ? buPROPion (WELLBUTRIN SR) 150 MG 12 hr tablet Take 150 mg by mouth 2 (two) times daily.    ? busPIRone (BUSPAR) 10 MG tablet Take 10 mg by mouth 2 (two) times daily.    ? capecitabine (XELODA) 500 MG tablet TAKE 3 TABLETS BY MOUTH 2 TIMES DAILY AFTER A MEAL ON DAYS OF RADIATION, MONDAY THROUGH FRIDAY. 90 tablet 0  ? estradiol (ESTRACE) 0.5 MG tablet Take 0.5 mg by mouth daily.    ? fluticasone (FLONASE) 50 MCG/ACT nasal spray Place 2 sprays into both nostrils daily. 16 g 0  ? hydrochlorothiazide (HYDRODIURIL) 50 MG tablet Take 50 mg by mouth daily.    ? KLOR-CON M20 20 MEQ tablet TAKE 1 TABLET BY MOUTH TWICE A DAY 180 tablet 1  ? ondansetron (ZOFRAN) 8 MG tablet Take 1 tablet (8 mg total) by mouth every 8 (eight) hours as needed for nausea or vomiting. 15 tablet 0  ? oxyCODONE (OXY IR/ROXICODONE) 5 MG immediate  release tablet Take 1 tablet (5 mg total) by mouth every 4 (four) hours as needed for severe pain. 20 tablet 0  ? SUMAtriptan (IMITREX) 50 MG tablet Take 50 mg by mouth daily as needed. (Patient not taking: Reported on 07/08/2021)    ? ?No current facility-administered medications for this visit.  ? ? ?PHYSICAL EXAMINATION: ?ECOG PERFORMANCE STATUS: 1 - Symptomatic but completely ambulatory ? ?Vitals:  ? 09/17/21 1215  ?BP: (!) 128/92  ?Pulse: 82  ?Resp: 18  ?Temp: 98.5 ?F (36.9 ?C)  ?SpO2: 100%  ? ?Wt Readings from Last 3 Encounters:  ?09/17/21 157 lb (71.2 kg)  ?09/01/21 156 lb 12.8 oz (71.1 kg)  ?08/06/21 160 lb 14.4 oz (73 kg)  ?  ? ?GENERAL:alert, no distress and comfortable ?SKIN: skin color normal, no rashes or significant lesions ?EYES: normal, Conjunctiva are pink and non-injected, sclera clear  ?NEURO: alert &  oriented x 3 with fluent speech ?RECTAL, external only: Mild skin hyperpigmentation, no ulcers or skin lesion. (+) External hemorrhoid. ? ?LABORATORY DATA:  ?I have reviewed the data as listed ? ?  Latest Ref Rng &

## 2021-09-17 NOTE — Therapy (Signed)
?OUTPATIENT PHYSICAL THERAPY TREATMENT NOTE ? ? ?Patient Name: Kayla Hobbs ?MRN: [id number] ?DOB:October 13, 1970, 51 y.o., female ?Today's Date: 09/17/2021 ? ?PCP: Patient, No Pcp Per (Inactive) ?REFERRING PROVIDER: Truitt Merle, MD ? ?END OF SESSION:  ? PT End of Session - 09/17/21 0853   ? ? Visit Number 2   ? Date for PT Re-Evaluation 11/18/21   ? Authorization Type BCBS   ? PT Start Time [phone]   ? PT Stop Time 0930   ? PT Time Calculation (min) 40 min   ? Activity Tolerance Patient tolerated treatment well   ? Behavior During Therapy [facility] for tasks assessed/performed   ? ?  ?  ? ?  ? ? ?Past Medical History:  ?Diagnosis Date  ? Arthritis   ? Hypertension   ? Skin cancer   ? basil top of heasd   ? ?Past Surgical History:  ?Procedure Laterality Date  ? PARTIAL HYSTERECTOMY  05/2020  ? due to heavy bleeding  ? SKIN CANCER EXCISION    ? ?Patient Active Problem List  ? Diagnosis Date Noted  ? Rectal cancer ([place]) 07/08/2021  ? Colon cancer screening 05/26/2021  ? Hematochezia 07/26/2019  ? Paresthesia 02/23/2019  ? ? ?REFERRING DIAG: C20 (ICD-10-CM) - Rectal cancer ([place]) ? ?THERAPY DIAG:  ?Muscle weakness (generalized) ? ?Other muscle spasm ? ?PERTINENT HISTORY: 3 vaginal deliveries ? ?PRECAUTIONS: none ? ?SUBJECTIVE: Pt states pain is when having BM.  Painful but regular and not straining.  Cannot stop it though and cannot stop.  No leakage ? ?PAIN:  ?Are you having pain? No ? ? ? ?OBJECTIVE: (objective measures completed at initial evaluation unless otherwise dated) ? ?  ?ONSET DATE:  5 weeks ago with radiation ?  ? ?BOWEL MOVEMENT ?Pain with bowel movement: Yes ?Type of bowel movement:Type (Bristol Stool Scale) 1-8 and Strain Yes ?Fully empty rectum: No ?Leakage: Yes: 1-2 but not a lot ?  ?  ?URINATION ?Pain with urination: Yes ?Fully empty bladder: Yes:   ?Stream: Strong ?Urgency: No ?Frequency: going every our ?Leakage:  no ?Pads: No ?  ?INTERCOURSE ?Pain with intercourse:  Yes ?Marinoff Scale: 3/3 ?  ?PREGNANCY ?Vaginal  deliveries 3 ?Tearing No ?  ?  ?OBJECTIVE:  ?  ?  ?COGNITION: ?           Overall cognitive status: Within functional limits for tasks assessed              ?  ?  ?MUSCLE LENGTH: ?Hamstrings: Right 60 deg; Left 50 deg ?  ?  ?  ?FUNCTIONAL TESTS:  ?SLS  ?  ?GAIT: ?  ?Comments: decreased step length ?  ?POSTURE:  ?Thoracic kyphosis mild ?  ?LUMBARAROM/PROM ?  ?A/PROM A/PROM  ?08/26/2021  ?Flexion 60%  ?Extension WFL  ?Right lateral flexion WFL  ?Left lateral flexion WFL  ?Right rotation WFL  ?Left rotation WFL  ? (Blank rows = not tested) ?                         ?Bil hip PROM - 75% limited throughout and painful  ?  ?LE MMT: ?  ?MMT Right ?08/26/2021 Left ?08/26/2021  ?Hip flexion      ?Hip extension      ?Hip abduction 4/5 4/5  ?Hip adduction 5/5 5/5  ?Hip internal rotation      ?Hip external rotation      ?Knee flexion      ?Knee extension      ?  Ankle dorsiflexion      ?Ankle plantarflexion      ?Ankle inversion      ?Ankle eversion      ?  ?  ?      PALPATION: ?  General  lumbar and thoracic tight ?  ?               ?              ?              Internal Pelvic Floor - deferred ?  ?  ?  ?TODAY'S TREATMENT  ?Treatment:09/17/21 ?Exercises  ?Cat cow ?Child pose ?Happy baby ?Single knee to chest ? ?Manual ?Abdominal myofascial release ?Lumbar MFR ? ?Trigger Point Dry-Needling  ?Treatment instructions: Expect mild to moderate muscle soreness. S/S of pneumothorax if dry needled over a lung field, and to seek immediate medical attention should they occur. Patient verbalized understanding of these instructions and education. ? ?Patient Consent Given: Yes ?Education handout provided: Yes ?Muscles treated: lumbar and thoracic multifi ?Electrical stimulation performed: No ?Parameters: N/A ?Treatment response/outcome: increased soft tissue length palpated ? ?Nuero Re-ed ?Education and cues for breathing with stretches ? ?Therapeutic activities ? ?Toileting and initial HEP educated and performed ?  ?  ?PATIENT EDUCATION:   ?Education details: Access Code: [email or web address] ?Person educated: Patient ?Education method: Explanation and Demonstration ?Education comprehension: verbalized understanding and returned demonstration ?  ?  ?HOME EXERCISE PROGRAM: ?Access Code: [email or web address] ?URL: [email or web address] ?Date: 09/17/2021 ?Prepared by: Jari Favre ? ?Exercises ?- Supine Diaphragmatic Breathing  - 3 x daily - 7 x weekly - 1 sets - 10 reps ?- Supine Lower Trunk Rotation  - 1 x daily - 7 x weekly - 1 sets - 10 reps - 5 sec hold ?- Thoracic Extension with Noodle/Towel  - 1 x daily - 7 x weekly - 3 sets - 10 reps ?- Cat Cow to Child's Pose  - 1 x daily - 7 x weekly - 1 sets - 5 reps - 10 sec hold ?- Quadruped Thoracic Spine Extension  - 1 x daily - 7 x weekly - 3 sets - 10 reps ?- Supine Pelvic Floor Stretch  - 1 x daily - 7 x weekly - 1 sets - 3 reps - 30 sec hold ?- Hooklying Single Knee to Chest  - 1 x daily - 7 x weekly - 3 sets - 10 reps ?ASSESSMENT: ?  ?CLINICAL IMPRESSION: ?Patient  did well with dry needling and STM.  Pt was very tight in lumbar, gluteals and pelvic/hip area and stretches were challenging.  Pt educated on breathing though the stretch in order to get more soft tissue mobility in the pelvic floor.  Pt will benefit from skilled PT to continue working on functional goals. ? ?OBJECTIVE IMPAIRMENTS decreased activity tolerance, decreased coordination, decreased endurance, decreased ROM, decreased strength, increased fascial restrictions, increased muscle spasms, impaired flexibility, impaired tone, postural dysfunction, and pain.  ?  ?ACTIVITY LIMITATIONS community activity, driving, occupation, and personal relationship, toileting .  ?  ?PERSONAL FACTORS 1-2 comorbidities: vaginal deliveries, hysterectomy, rectal cancer with radiation and 3+ comorbidities:    are also affecting patient's functional outcome.  ?  ?  ?REHAB POTENTIAL: Excellent ?  ?CLINICAL DECISION MAKING: Evolving/moderate complexity ?   ?EVALUATION COMPLEXITY: Moderate ?  ?  ?GOALS: ?Goals reviewed with patient? Yes ?  ?SHORT TERM GOALS: Target date: 09/23/2021 ?  ?Ind with toileting techniques ?Baseline: ?Goal status: met ?  ?  ?  ?  LONG TERM GOALS: Target date: 11/18/2021 ?  ?Pt will be independent with advanced HEP to maintain improvements made throughout therapy ?  ?Baseline:  ?Goal status: INITIAL ?  ?2.  Pt will report 50% reduction of pain during BM to improvements in posture, strength, and muscle length ?  ?Baseline:  ?Goal status: INITIAL ?  ?3.  Pt will be able to have a BM without straining ?Baseline:  ?Goal status: INITIAL ?  ?4.  Pt will be able to fully empty bladder without pain  ?Baseline:  ?Goal status: INITIAL ?  ?  ?  ?PLAN: ?PT FREQUENCY: 1x/week ?  ?PT DURATION: 12 weeks ?  ?PLANNED INTERVENTIONS: Therapeutic exercises, Therapeutic activity, Neuromuscular re-education, Balance training, Gait training, Patient/Family education, Joint mobilization, Dry Needling, Electrical stimulation, Cryotherapy, Moist heat, Biofeedback, and Manual therapy ?  ?PLAN FOR NEXT SESSION: f/u on stretches, abdominal fascial release and perineal release as tolerated, sitting on foam noodle or ball ?  ? ? ?Jule Ser, PT ?09/17/2021, 8:53 AM ? ?  ? ?

## 2021-09-21 ENCOUNTER — Encounter: Payer: Self-pay | Admitting: Licensed Clinical Social Worker

## 2021-09-21 NOTE — Progress Notes (Signed)
[place] ?Clinical Social Work ? ?Clinical Social Work was referred by medical provider for assessment of psychosocial needs.  Clinical Social Worker  left voicemail for pt   to offer support and assess for needs.  CSW to continue to attempt to reach pt. ? ? ? ? ? ?Henriette Combs, LCSW  ?Clinical Social Worker ?[place] ?      ? ?

## 2021-09-22 ENCOUNTER — Telehealth: Payer: Self-pay | Admitting: Hematology

## 2021-09-22 NOTE — Telephone Encounter (Signed)
Left message with follow-up appointment per 5/11 los. ?

## 2021-09-23 NOTE — Progress Notes (Deleted)
                                                                                                                                                             Patient Name: Kayla Hobbs MRN: [id number] DOB: 04/15/1971 Referring Physician: Truitt Merle (Profile Not Attached) Date of Service: 09/02/2021 [facility], [place]                                                        End Of Treatment Note  Diagnoses: C20-Malignant neoplasm of rectum  Cancer Staging:  Stage I, cT2N0M0 adenocarcinoma of the distal rectum  Intent: Curative  Radiation Treatment Dates: 07/27/2021 through 09/02/2021 Site Technique Total Dose (Gy) Dose per Fx (Gy) Completed Fx Beam Energies  Rectum: Rectum 3D 45/45 1.8 25/25 10X, 15X  Rectum: Rectum_Bst 3D 5.4/5.4 1.8 3/3 10X, 15X   Narrative: The patient tolerated radiation therapy relatively well. She developed fatigue, urinary and fecal frequency, diarrhea, and rectal discomfort requiring narcotics though her skin in the anal region remained intact.   Plan: The patient will receive a call in about one month from the radiation oncology department. She will continue follow up with Dr. Burr Medico and Dr. Marcello Moores as well.   ________________________________________________    Carola Rhine, [facility]

## 2021-09-24 ENCOUNTER — Encounter: Payer: Self-pay | Admitting: Radiation Oncology

## 2021-09-24 ENCOUNTER — Encounter: Payer: BLUE CROSS/BLUE SHIELD | Admitting: Physical Therapy

## 2021-09-24 NOTE — Progress Notes (Signed)
  Patient Name: Kayla Hobbs MRN: [id number] DOB: 06-30-1970 Referring Physician: Truitt Merle (Profile Not Attached) Date of Service: 09/02/2021 [facility], [place]                                                         End Of Treatment Note  Diagnoses: C20-Malignant neoplasm of rectum  Cancer Staging:  Stage I, cT2N0M0 adenocarcinoma of the distal rectum  Intent: Curative  Radiation Treatment Dates: 07/27/2021 through 09/02/2021 Site Technique Total Dose (Gy) Dose per Fx (Gy) Completed Fx Beam Energies  Rectum: Rectum 3D 45/45 1.8 25/25 10X, 15X  Rectum: Rectum_Bst 3D 5.4/5.4 1.8 3/3 10X, 15X   Narrative: The patient tolerated radiation therapy relatively well. She developed fatigue, urinary and fecal frequency, diarrhea, and rectal discomfort requiring narcotics though her skin in the anal region remained intact.   Plan: The patient will receive a call in about one month from the radiation oncology department. She will continue follow up with Dr. Burr Medico and Dr. Marcello Moores as well.   ________________________________________________      Carola Rhine, [facility]

## 2021-09-24 NOTE — Progress Notes (Signed)
Entered in error

## 2021-10-01 ENCOUNTER — Ambulatory Visit: Payer: BLUE CROSS/BLUE SHIELD | Admitting: Physical Therapy

## 2021-10-01 ENCOUNTER — Telehealth: Payer: Self-pay | Admitting: Physical Therapy

## 2021-10-01 NOTE — Telephone Encounter (Signed)
Patient did not show for appointment.  Patient was called and PT left message to please call us back.  Gustavus Bryant, PT 10/01/21 9:16 AM

## 2021-10-01 NOTE — Therapy (Unsigned)
OUTPATIENT PHYSICAL THERAPY TREATMENT NOTE   Patient Name: Kayla Hobbs MRN: [id number] DOB:10-31-1970, 51 y.o., female Today's Date: 10/01/2021  PCP: Patient, No Pcp Per (Inactive) REFERRING PROVIDER: Truitt Merle, MD  END OF SESSION:     Past Medical History:  Diagnosis Date   Arthritis    Hypertension    Skin cancer    basil top of heasd    Past Surgical History:  Procedure Laterality Date   PARTIAL HYSTERECTOMY  05/2020   due to heavy bleeding   SKIN CANCER EXCISION     Patient Active Problem List   Diagnosis Date Noted   Rectal cancer ([place]) 07/08/2021   Colon cancer screening 05/26/2021   Hematochezia 07/26/2019   Paresthesia 02/23/2019    REFERRING DIAG: C20 (ICD-10-CM) - Rectal cancer ([place])  THERAPY DIAG:  No diagnosis found.  PERTINENT HISTORY: 3 vaginal deliveries  PRECAUTIONS: none  SUBJECTIVE: Pt states pain is when having BM.  Painful but regular and not straining.  Cannot stop it though and cannot stop.  No leakage  PAIN:  Are you having pain? No    OBJECTIVE: (objective measures completed at initial evaluation unless otherwise dated)    ONSET DATE:  5 weeks ago with radiation    BOWEL MOVEMENT Pain with bowel movement: Yes Type of bowel movement:Type (Bristol Stool Scale) 1-8 and Strain Yes Fully empty rectum: No Leakage: Yes: 1-2 but not a lot     URINATION Pain with urination: Yes Fully empty bladder: Yes:   Stream: Strong Urgency: No Frequency: going every our Leakage:  no Pads: No   INTERCOURSE Pain with intercourse:  Yes Marinoff Scale: 3/3   PREGNANCY Vaginal deliveries 3 Tearing No     OBJECTIVE:      COGNITION:            Overall cognitive status: Within functional limits for tasks assessed                  MUSCLE LENGTH: Hamstrings: Right 60 deg; Left 50 deg       FUNCTIONAL TESTS:  SLS    GAIT:   Comments: decreased step length   POSTURE:  Thoracic kyphosis mild   LUMBARAROM/PROM   A/PROM  A/PROM  08/26/2021  Flexion 60%  Extension WFL  Right lateral flexion WFL  Left lateral flexion WFL  Right rotation WFL  Left rotation WFL   (Blank rows = not tested)                          Bil hip PROM - 75% limited throughout and painful    LE MMT:   MMT Right 08/26/2021 Left 08/26/2021  Hip flexion      Hip extension      Hip abduction 4/5 4/5  Hip adduction 5/5 5/5  Hip internal rotation      Hip external rotation      Knee flexion      Knee extension      Ankle dorsiflexion      Ankle plantarflexion      Ankle inversion      Ankle eversion                PALPATION:   General  lumbar and thoracic tight  Internal Pelvic Floor - deferred       TODAY'S TREATMENT  Treatment:09/17/21 Exercises  Cat cow Child pose Happy baby Single knee to chest  Manual Abdominal myofascial release Lumbar MFR  Trigger Point Dry-Needling  Treatment instructions: Expect mild to moderate muscle soreness. S/S of pneumothorax if dry needled over a lung field, and to seek immediate medical attention should they occur. Patient verbalized understanding of these instructions and education.  Patient Consent Given: Yes Education handout provided: Yes Muscles treated: lumbar and thoracic multifi Electrical stimulation performed: No Parameters: N/A Treatment response/outcome: increased soft tissue length palpated  Nuero Re-ed Education and cues for breathing with stretches  Therapeutic activities  Toileting and initial HEP educated and performed     PATIENT EDUCATION:  Education details: Access Code: [email or web address] Person educated: Patient Education method: Customer service manager Education comprehension: verbalized understanding and returned demonstration     HOME EXERCISE PROGRAM: Access Code: [email or web address] URL: [email or web address] Date: 09/17/2021 Prepared by: Jari Favre  Exercises - Supine  Diaphragmatic Breathing  - 3 x daily - 7 x weekly - 1 sets - 10 reps - Supine Lower Trunk Rotation  - 1 x daily - 7 x weekly - 1 sets - 10 reps - 5 sec hold - Thoracic Extension with Noodle/Towel  - 1 x daily - 7 x weekly - 3 sets - 10 reps - Cat Cow to Child's Pose  - 1 x daily - 7 x weekly - 1 sets - 5 reps - 10 sec hold - Quadruped Thoracic Spine Extension  - 1 x daily - 7 x weekly - 3 sets - 10 reps - Supine Pelvic Floor Stretch  - 1 x daily - 7 x weekly - 1 sets - 3 reps - 30 sec hold - Hooklying Single Knee to Chest  - 1 x daily - 7 x weekly - 3 sets - 10 reps ASSESSMENT:   CLINICAL IMPRESSION: Patient  did well with dry needling and STM.  Pt was very tight in lumbar, gluteals and pelvic/hip area and stretches were challenging.  Pt educated on breathing though the stretch in order to get more soft tissue mobility in the pelvic floor.  Pt will benefit from skilled PT to continue working on functional goals.  OBJECTIVE IMPAIRMENTS decreased activity tolerance, decreased coordination, decreased endurance, decreased ROM, decreased strength, increased fascial restrictions, increased muscle spasms, impaired flexibility, impaired tone, postural dysfunction, and pain.    ACTIVITY LIMITATIONS community activity, driving, occupation, and personal relationship, toileting .    PERSONAL FACTORS 1-2 comorbidities: vaginal deliveries, hysterectomy, rectal cancer with radiation and 3+ comorbidities:    are also affecting patient's functional outcome.      REHAB POTENTIAL: Excellent   CLINICAL DECISION MAKING: Evolving/moderate complexity   EVALUATION COMPLEXITY: Moderate     GOALS: Goals reviewed with patient? Yes   SHORT TERM GOALS: Target date: 09/23/2021   Ind with toileting techniques Baseline: Goal status: met       LONG TERM GOALS: Target date: 11/18/2021   Pt will be independent with advanced HEP to maintain improvements made throughout therapy   Baseline:  Goal status: INITIAL    2.  Pt will report 50% reduction of pain during BM to improvements in posture, strength, and muscle length   Baseline:  Goal status: INITIAL   3.  Pt will be able to have a BM without straining Baseline:  Goal status: INITIAL   4.  Pt will be able to fully empty bladder without pain  Baseline:  Goal status: INITIAL       PLAN: PT FREQUENCY: 1x/week   PT DURATION: 12 weeks   PLANNED INTERVENTIONS: Therapeutic exercises, Therapeutic activity, Neuromuscular re-education, Balance training, Gait training, Patient/Family education, Joint mobilization, Dry Needling, Electrical stimulation, Cryotherapy, Moist heat, Biofeedback, and Manual therapy   PLAN FOR NEXT SESSION: f/u on stretches, abdominal fascial release and perineal release as tolerated, sitting on foam noodle or ball     Camillo Flaming Regenia Erck, PT 10/01/2021, 7:49 AM

## 2021-10-13 ENCOUNTER — Ambulatory Visit (INDEPENDENT_AMBULATORY_CARE_PROVIDER_SITE_OTHER): Payer: BLUE CROSS/BLUE SHIELD | Admitting: Gastroenterology

## 2021-10-13 ENCOUNTER — Encounter: Payer: Self-pay | Admitting: Gastroenterology

## 2021-10-13 VITALS — BP 138/92 | HR 100 | Ht 64.0 in | Wt 157.1 lb

## 2021-10-13 DIAGNOSIS — C20 Malignant neoplasm of rectum: Secondary | ICD-10-CM | POA: Diagnosis not present

## 2021-10-13 DIAGNOSIS — A630 Anogenital (venereal) warts: Secondary | ICD-10-CM | POA: Diagnosis not present

## 2021-10-13 NOTE — Patient Instructions (Addendum)
If you are age 53 or older, your body mass index should be between 23-30. Your Body mass index is 26.97 kg/m. If this is out of the aforementioned range listed, please consider follow up with your Primary Care Provider.  If you are age 55 or younger, your body mass index should be between 19-25. Your Body mass index is 26.97 kg/m. If this is out of the aformentioned range listed, please consider follow up with your Primary Care Provider.   ________________________________________________________  The [facility] providers would like to encourage you to use [facility] to communicate with providers for non-urgent requests or questions.  Due to long hold times on the telephone, sending your provider a message by [facility] may be a faster and more efficient way to get a response.  Please allow 48 business hours for a response.  Please remember that this is for non-urgent requests.  _______________________________________________________  Kayla Hobbs have been scheduled for a flexible sigmoidoscopy. Please follow the written instructions given to you at your visit today. If you use inhalers (even only as needed), please bring them with you on the day of your procedure.   Thank you for entrusting me with your care and for choosing [facility], Dr. [place] Cellar

## 2021-10-13 NOTE — Progress Notes (Signed)
HPI :  51 year old female here for a follow-up visit for recent diagnosis of rectal cancer.  Recall she saw me for her first colonoscopy for rectal bleeding in February.  She had a large polypoid lesion in the distal rectum that was removed via EMR.  This turned out to be adenocarcinoma arising in a tubular adenoma with high-grade dysplasia, there was a clear margin of 1 mm.  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  Her case was discussed at multidisciplinary conference.  She was seen by colorectal surgery and oncology.  They felt given the clear margin she did not need surgery.  She was, however, treated with chemotherapy with Xeloda as well as radiation.  She states she stopped the regimen and completed it over a month ago.  She states radiation was tough but she appears to be recovered and doing much better.  She has occasional rectal pain but that is resolving.  She has no constipation.  She has 2-3 soft bowel movements per day.  Rare blood in the stool.  She had 1 episode of leakage/incontinence.  She is working with pelvic floor PT to strengthen the pelvic floor.  Generally she is doing much better than she was when she was on radiation therapy. No known family history of colon polyps or colon cancer.  She follows with Dr. Burr Medico of Oncology and Dr. Marcello Moores of [place]. given she was receiving radiation and they decided to monitor the condyloma and reassess after 6 months.  Colonoscopy 06/18/21: Polypoid lesion found on digital rectal exam. - One diminutive polyp in the ascending colon, removed with a cold biopsy forceps. Resected and retrieved. - One 5 mm polyp in the sigmoid colon, removed with a cold snare. Resected and retrieved. - One large polyp in the distal rectum. Resected and retrieved as outlined. Injected / tattooed. Clips were placed. - Polypoid lesions in the distal rectum. Biopsied. - Internal hemorrhoids. - The examination was otherwise  normal.  1. Surgical [P], colon, distal rectal biopsy - CONDYLOMA ACUMINATA 2. Surgical [P], colon, ascending, sigmoid, polyp (2) - TUBULAR ADENOMA (2 OF 2 FRAGMENTS) - NO HIGH-GRADE DYSPLASIA OR MALIGNANCY IDENTIFIED 3. Surgical [P], colon, rectum, polyp (1) - ADENOCARCINOMA ARISING IN A TUBULAR ADENOMA WITH HIGH-GRADE DYSPLASIA - SEE COMMENT  The invasive adenocarcinoma appears moderately differentiated and invades the submucosa for a depth of 0.8 cm. The adenocarcinoma comes within 0.1 cm from the base of the polyp. High-grade features including lymphovascular space invasion, tumor budding or poor differentiation are not identified. Dr. Vic Ripper reviewed the case and agrees with the above diagnosis.  MMR testing normal  CT C/A/P 07/02/21: IMPRESSION: Negative. No evidence of metastatic disease or other significant abnormality.  MRI pelvis 07/07/21: IMPRESSION: Rectal adenocarcinoma T stage: T2.  Lesion not identified   Rectal adenocarcinoma N stage:  N0   Distance from tumor to the internal anal sphincter. Lesion not Identified   Past Medical History:  Diagnosis Date   Arthritis    Hypertension    Rectal cancer ([place])    Skin cancer    [place]      Past Surgical History:  Procedure Laterality Date   PARTIAL HYSTERECTOMY  05/2020   due to heavy bleeding   SKIN CANCER EXCISION     Family History  Problem Relation Age of Onset   Heart disease Father    Cancer Brother        liver cancer possibly [place]  Cancer Maternal Grandmother        pancreas   Pancreatic cancer Maternal Grandmother    Diabetes Paternal Grandmother    Heart disease Paternal Grandfather    Cancer Cousin        leukemia   Colon cancer Neg Hx    Stomach cancer Neg Hx    Esophageal cancer Neg Hx    Social History   Tobacco Use   Smoking status: Former    Types: Cigarettes   Smokeless tobacco: Never   Tobacco comments:    Smoked intermittently up to 5 cig per day for 10-15  years, quit 5 years ago  Vaping Use   Vaping Use: Never used  Substance Use Topics   Alcohol use: Yes    Comment: wine, daily   Drug use: Never   Current Outpatient Medications  Medication Sig Dispense Refill   buPROPion (WELLBUTRIN SR) 150 MG 12 hr tablet Take 150 mg by mouth 2 (two) times daily.     busPIRone (BUSPAR) 15 MG tablet Take 15 mg by mouth daily in the afternoon.     estradiol (ESTRACE) 0.5 MG tablet Take 0.5 mg by mouth daily.     fluticasone (FLONASE) 50 MCG/ACT nasal spray Place 2 sprays into both nostrils daily. 16 g 0   KLOR-CON M20 20 MEQ tablet TAKE 1 TABLET BY MOUTH TWICE A DAY (Patient not taking: Reported on 10/13/2021) 180 tablet 1   SUMAtriptan (IMITREX) 50 MG tablet Take 50 mg by mouth daily as needed. (Patient not taking: Reported on 07/08/2021)     No current facility-administered medications for this visit.   No Known Allergies   Review of Systems: All systems reviewed and negative except where noted in HPI.    Lab Results  Component Value Date   WBC 4.1 09/17/2021   HGB 13.4 09/17/2021   HCT 37.7 09/17/2021   MCV 92.2 09/17/2021   PLT 177 09/17/2021    Lab Results  Component Value Date   CREATININE 0.98 09/17/2021   BUN 13 09/17/2021   NA 140 09/17/2021   K 2.8 (L) 09/17/2021   CL 100 09/17/2021   CO2 34 (H) 09/17/2021    Lab Results  Component Value Date   ALT 9 09/17/2021   AST 12 (L) 09/17/2021   ALKPHOS 79 09/17/2021   BILITOT 0.5 09/17/2021     Physical Exam: BP (!) 138/92   Pulse 100   Ht 5' 4"  (1.626 m)   Wt 157 lb 2 oz (71.3 kg)   BMI 26.97 kg/m  Constitutional: Pleasant,well-developed, female in no acute distress. Neurological: Alert and oriented to person place and time. Psychiatric: Normal mood and affect. Behavior is normal.   ASSESSMENT AND PLAN: 51 year old female here for reassessment of the following:  Rectal cancer Anorectal condyloma  As above, polypoid rectal cancer that was removed endoscopically via  EMR that was found to have a clear margin.  Following multidisciplinary conference she was treated conservatively and avoided surgery.  Managed per oncology with Xeloda and a course of radiation which she has completed.  Generally doing pretty well at this point now that she is finished radiation.  She is following with pelvic floor PT.  We discussed her history, plans for surveillance moving forward.  While she is in need of a full colonoscopy 1 year after her diagnosis, I am recommending a flex sig to reassess the site of her cancer to make sure no residual or recurrent disease there after she has completed therapy.  We will plan on doing this in the next month or so.  I discussed risk benefits of the exam and anesthesia and she wants to proceed.  She may awake without sedation given it will be a very limited exam, she will think about this.  Otherwise she will continue to follow-up with oncology for surveillance imaging every year.  She will follow-up with Dr. Marcello Moores for endoscopy to reassess the condyloma not she has completed radiation.  I will also assess for this endoscopically in her rectum.  She agreed with the plan, all questions answered.  I spent 30 minutes of time, including in depth chart review, iface-to-face time with the patient,  and documenting this encounter.  Jolly Mango, MD [facility]

## 2021-10-14 ENCOUNTER — Encounter: Payer: Self-pay | Admitting: Hematology

## 2021-10-15 ENCOUNTER — Encounter: Payer: BLUE CROSS/BLUE SHIELD | Admitting: Physical Therapy

## 2021-10-15 ENCOUNTER — Other Ambulatory Visit: Payer: Self-pay

## 2021-10-22 ENCOUNTER — Encounter: Payer: BLUE CROSS/BLUE SHIELD | Admitting: Physical Therapy

## 2021-10-28 NOTE — Therapy (Unsigned)
OUTPATIENT PHYSICAL THERAPY TREATMENT NOTE   Patient Name: Kayla Hobbs MRN: [id number] DOB:December 13, 1970, 51 y.o., female Today's Date: 10/28/2021  PCP: Patient, No Pcp Per (Inactive) REFERRING PROVIDER: Truitt Merle, MD  END OF SESSION:     Past Medical History:  Diagnosis Date   Arthritis    Hypertension    Rectal cancer ([place])    Skin cancer    [place]    Past Surgical History:  Procedure Laterality Date   PARTIAL HYSTERECTOMY  05/2020   due to heavy bleeding   SKIN CANCER EXCISION     Patient Active Problem List   Diagnosis Date Noted   Rectal cancer ([place]) 07/08/2021   Colon cancer screening 05/26/2021   Hematochezia 07/26/2019   Paresthesia 02/23/2019    REFERRING DIAG: C20 (ICD-10-CM) - Rectal cancer ([place])  THERAPY DIAG:  No diagnosis found.  PERTINENT HISTORY: 3 vaginal deliveries  PRECAUTIONS: none  SUBJECTIVE: Pt states pain is when having BM.  Painful but regular and not straining.  Cannot stop it though and cannot stop.  No leakage  PAIN:  Are you having pain? No    OBJECTIVE: (objective measures completed at initial evaluation unless otherwise dated)    ONSET DATE:  5 weeks ago with radiation    BOWEL MOVEMENT Pain with bowel movement: Yes Type of bowel movement:Type (Bristol Stool Scale) 1-8 and Strain Yes Fully empty rectum: No Leakage: Yes: 1-2 but not a lot     URINATION Pain with urination: Yes Fully empty bladder: Yes:   Stream: Strong Urgency: No Frequency: going every our Leakage:  no Pads: No   INTERCOURSE Pain with intercourse:  Yes Marinoff Scale: 3/3   PREGNANCY Vaginal deliveries 3 Tearing No     OBJECTIVE:      COGNITION:            Overall cognitive status: Within functional limits for tasks assessed                  MUSCLE LENGTH: Hamstrings: Right 60 deg; Left 50 deg       FUNCTIONAL TESTS:  SLS    GAIT:   Comments: decreased step length   POSTURE:  Thoracic kyphosis mild    LUMBARAROM/PROM   A/PROM A/PROM  08/26/2021  Flexion 60%  Extension WFL  Right lateral flexion WFL  Left lateral flexion WFL  Right rotation WFL  Left rotation WFL   (Blank rows = not tested)                          Bil hip PROM - 75% limited throughout and painful    LE MMT:   MMT Right 08/26/2021 Left 08/26/2021  Hip flexion      Hip extension      Hip abduction 4/5 4/5  Hip adduction 5/5 5/5  Hip internal rotation      Hip external rotation      Knee flexion      Knee extension      Ankle dorsiflexion      Ankle plantarflexion      Ankle inversion      Ankle eversion                PALPATION:   General  lumbar and thoracic tight  Internal Pelvic Floor - deferred       TODAY'S TREATMENT  Treatment:09/17/21 Exercises  Cat cow Child pose Happy baby Single knee to chest  Manual Abdominal myofascial release Lumbar MFR  Trigger Point Dry-Needling  Treatment instructions: Expect mild to moderate muscle soreness. S/S of pneumothorax if dry needled over a lung field, and to seek immediate medical attention should they occur. Patient verbalized understanding of these instructions and education.  Patient Consent Given: Yes Education handout provided: Yes Muscles treated: lumbar and thoracic multifi Electrical stimulation performed: No Parameters: N/A Treatment response/outcome: increased soft tissue length palpated  Nuero Re-ed Education and cues for breathing with stretches  Therapeutic activities  Toileting and initial HEP educated and performed     PATIENT EDUCATION:  Education details: Access Code: [email or web address] Person educated: Patient Education method: Customer service manager Education comprehension: verbalized understanding and returned demonstration     HOME EXERCISE PROGRAM: Access Code: [email or web address] URL: [email or web address] Date: 09/17/2021 Prepared by: Jari Favre  Exercises - Supine Diaphragmatic Breathing  - 3 x daily - 7 x weekly - 1 sets - 10 reps - Supine Lower Trunk Rotation  - 1 x daily - 7 x weekly - 1 sets - 10 reps - 5 sec hold - Thoracic Extension with Noodle/Towel  - 1 x daily - 7 x weekly - 3 sets - 10 reps - Cat Cow to Child's Pose  - 1 x daily - 7 x weekly - 1 sets - 5 reps - 10 sec hold - Quadruped Thoracic Spine Extension  - 1 x daily - 7 x weekly - 3 sets - 10 reps - Supine Pelvic Floor Stretch  - 1 x daily - 7 x weekly - 1 sets - 3 reps - 30 sec hold - Hooklying Single Knee to Chest  - 1 x daily - 7 x weekly - 3 sets - 10 reps ASSESSMENT:   CLINICAL IMPRESSION: Patient  did well with dry needling and STM.  Pt was very tight in lumbar, gluteals and pelvic/hip area and stretches were challenging.  Pt educated on breathing though the stretch in order to get more soft tissue mobility in the pelvic floor.  Pt will benefit from skilled PT to continue working on functional goals.  OBJECTIVE IMPAIRMENTS decreased activity tolerance, decreased coordination, decreased endurance, decreased ROM, decreased strength, increased fascial restrictions, increased muscle spasms, impaired flexibility, impaired tone, postural dysfunction, and pain.    ACTIVITY LIMITATIONS community activity, driving, occupation, and personal relationship, toileting .    PERSONAL FACTORS 1-2 comorbidities: vaginal deliveries, hysterectomy, rectal cancer with radiation and 3+ comorbidities:    are also affecting patient's functional outcome.      REHAB POTENTIAL: Excellent   CLINICAL DECISION MAKING: Evolving/moderate complexity   EVALUATION COMPLEXITY: Moderate     GOALS: Goals reviewed with patient? Yes   SHORT TERM GOALS: Target date: 09/23/2021   Ind with toileting techniques Baseline: Goal status: met       LONG TERM GOALS: Target date: 11/18/2021   Pt will be independent with advanced HEP to maintain improvements made throughout therapy    Baseline:  Goal status: INITIAL   2.  Pt will report 50% reduction of pain during BM to improvements in posture, strength, and muscle length   Baseline:  Goal status: INITIAL   3.  Pt will be able to have a BM without straining Baseline:  Goal status: INITIAL   4.  Pt will be able to fully empty bladder without pain  Baseline:  Goal status: INITIAL       PLAN: PT FREQUENCY: 1x/week   PT DURATION: 12 weeks   PLANNED INTERVENTIONS: Therapeutic exercises, Therapeutic activity, Neuromuscular re-education, Balance training, Gait training, Patient/Family education, Joint mobilization, Dry Needling, Electrical stimulation, Cryotherapy, Moist heat, Biofeedback, and Manual therapy   PLAN FOR NEXT SESSION: f/u on stretches, abdominal fascial release and perineal release as tolerated, sitting on foam noodle or ball     Camillo Flaming Mariesa Grieder, PT 10/28/2021, 5:30 PM

## 2021-10-29 ENCOUNTER — Encounter: Payer: Self-pay | Admitting: Physical Therapy

## 2021-10-29 ENCOUNTER — Ambulatory Visit: Payer: BLUE CROSS/BLUE SHIELD | Attending: Hematology | Admitting: Physical Therapy

## 2021-10-29 DIAGNOSIS — M62838 Other muscle spasm: Secondary | ICD-10-CM | POA: Insufficient documentation

## 2021-10-29 DIAGNOSIS — M6281 Muscle weakness (generalized): Secondary | ICD-10-CM | POA: Insufficient documentation

## 2021-11-05 ENCOUNTER — Encounter: Payer: BLUE CROSS/BLUE SHIELD | Admitting: Physical Therapy

## 2021-11-05 NOTE — Therapy (Deleted)
OUTPATIENT PHYSICAL THERAPY TREATMENT NOTE   Patient Name: Kayla Hobbs MRN: [id number] DOB:1970-07-17, 51 y.o., female Today's Date: 11/05/2021  PCP: Patient, No Pcp Per (Inactive) REFERRING PROVIDER: Truitt Merle, MD  END OF SESSION:      Past Medical History:  Diagnosis Date   Arthritis    Hypertension    Rectal cancer ([place])    Skin cancer    [place]    Past Surgical History:  Procedure Laterality Date   PARTIAL HYSTERECTOMY  05/2020   due to heavy bleeding   SKIN CANCER EXCISION     Patient Active Problem List   Diagnosis Date Noted   Rectal cancer ([place]) 07/08/2021   Colon cancer screening 05/26/2021   Hematochezia 07/26/2019   Paresthesia 02/23/2019    REFERRING DIAG: C20 (ICD-10-CM) - Rectal cancer ([place])  THERAPY DIAG:  No diagnosis found.  PERTINENT HISTORY: 3 vaginal deliveries  PRECAUTIONS: none  SUBJECTIVE: Pt states pain is when having BM.  Painful but regular and not straining.  Cannot stop it though and cannot stop.  No leakage  PAIN:  Are you having pain? No    OBJECTIVE: (objective measures completed at initial evaluation unless otherwise dated)    ONSET DATE:  5 weeks ago with radiation    BOWEL MOVEMENT Pain with bowel movement: Yes Type of bowel movement:Type (Bristol Stool Scale) 1-8 and Strain Yes Fully empty rectum: No Leakage: Yes: 1-2 but not a lot     URINATION Pain with urination: Yes Fully empty bladder: Yes:   Stream: Strong Urgency: No Frequency: going every our Leakage:  no Pads: No   INTERCOURSE Pain with intercourse:  Yes Marinoff Scale: 3/3   PREGNANCY Vaginal deliveries 3 Tearing No     OBJECTIVE:      COGNITION:            Overall cognitive status: Within functional limits for tasks assessed                  MUSCLE LENGTH: Hamstrings: Right 60 deg; Left 50 deg       FUNCTIONAL TESTS:  SLS    GAIT:   Comments: decreased step length   POSTURE:  Thoracic kyphosis mild    LUMBARAROM/PROM   A/PROM A/PROM  08/26/2021  Flexion 60%  Extension WFL  Right lateral flexion WFL  Left lateral flexion WFL  Right rotation WFL  Left rotation WFL   (Blank rows = not tested)                          Bil hip PROM - 75% limited throughout and painful    LE MMT:   MMT Right 08/26/2021 Left 08/26/2021  Hip flexion      Hip extension      Hip abduction 4/5 4/5  Hip adduction 5/5 5/5  Hip internal rotation      Hip external rotation      Knee flexion      Knee extension      Ankle dorsiflexion      Ankle plantarflexion      Ankle inversion      Ankle eversion                PALPATION:   General  lumbar and thoracic tight  Internal Pelvic Floor - deferred       TODAY'S TREATMENT  Treatment:10/29/21 Exercises  Cat cow Child pose Happy baby Single knee to chest  Manual  Lumbar and thoracic MFR; gluteal and lumbar STM  Trigger Point Dry-Needling  Treatment instructions: Expect mild to moderate muscle soreness. S/S of pneumothorax if dry needled over a lung field, and to seek immediate medical attention should they occur. Patient verbalized understanding of these instructions and education.  Patient Consent Given: Yes Education handout provided: Yes Muscles treated: lumbar and thoracic multifi Electrical stimulation performed: No Parameters: N/A Treatment response/outcome: increased soft tissue length palpated  Treatment:09/17/21 Exercises  Cat cow Child pose Happy baby Single knee to chest  Manual Abdominal myofascial release Lumbar MFR  Trigger Point Dry-Needling  Treatment instructions: Expect mild to moderate muscle soreness. S/S of pneumothorax if dry needled over a lung field, and to seek immediate medical attention should they occur. Patient verbalized understanding of these instructions and education.  Patient Consent Given: Yes Education handout provided: Yes Muscles treated:  lumbar and thoracic multifi Electrical stimulation performed: No Parameters: N/A Treatment response/outcome: increased soft tissue length palpated  Nuero Re-ed Education and cues for breathing with stretches  Therapeutic activities  Toileting and initial HEP educated and performed     PATIENT EDUCATION:  Education details: Access Code: [email or web address] Person educated: Patient Education method: Customer service manager Education comprehension: verbalized understanding and returned demonstration     HOME EXERCISE PROGRAM: Access Code: [email or web address] URL: [email or web address] Date: 09/17/2021 Prepared by: Jari Favre  Exercises - Supine Diaphragmatic Breathing  - 3 x daily - 7 x weekly - 1 sets - 10 reps - Supine Lower Trunk Rotation  - 1 x daily - 7 x weekly - 1 sets - 10 reps - 5 sec hold - Thoracic Extension with Noodle/Towel  - 1 x daily - 7 x weekly - 3 sets - 10 reps - Cat Cow to Child's Pose  - 1 x daily - 7 x weekly - 1 sets - 5 reps - 10 sec hold - Quadruped Thoracic Spine Extension  - 1 x daily - 7 x weekly - 3 sets - 10 reps - Supine Pelvic Floor Stretch  - 1 x daily - 7 x weekly - 1 sets - 3 reps - 30 sec hold - Hooklying Single Knee to Chest  - 1 x daily - 7 x weekly - 3 sets - 10 reps ASSESSMENT:   CLINICAL IMPRESSION: Patient  did well with dry needling and STM.  Pt was very tight in lumbar, gluteals and pelvic/hip area was improved from previous session and able to get deeper into the stretches. Overall, she is having improved bowel movements, but still painful.  Pt educated on breathing though the stretch in order to get more soft tissue mobility in the pelvic floor.  Pt will benefit from skilled PT to continue working on functional goals.  OBJECTIVE IMPAIRMENTS decreased activity tolerance, decreased coordination, decreased endurance, decreased ROM, decreased strength, increased fascial restrictions, increased muscle spasms, impaired flexibility,  impaired tone, postural dysfunction, and pain.    ACTIVITY LIMITATIONS community activity, driving, occupation, and personal relationship, toileting .    PERSONAL FACTORS 1-2 comorbidities: vaginal deliveries, hysterectomy, rectal cancer with radiation and 3+ comorbidities:    are also affecting patient's functional outcome.      REHAB POTENTIAL: Excellent   CLINICAL DECISION MAKING: Evolving/moderate complexity   EVALUATION COMPLEXITY: Moderate     GOALS: Goals reviewed with patient? Yes   SHORT  TERM GOALS: Target date: 09/23/2021   Ind with toileting techniques Baseline: Goal status: met       LONG TERM GOALS: Target date: 11/18/2021   Pt will be independent with advanced HEP to maintain improvements made throughout therapy   Baseline:  Goal status: met   2.  Pt will report 50% reduction of pain during BM to improvements in posture, strength, and muscle length   Baseline:  Goal status: Ongoing   3.  Pt will be able to have a BM without straining Baseline: 10/29/21  Goal status: Met   4.  Pt will be able to fully empty bladder without pain  Baseline: 10/29/21  Goal status: Met       PLAN: PT FREQUENCY: 1x/week   PT DURATION: 12 weeks   PLANNED INTERVENTIONS: Therapeutic exercises, Therapeutic activity, Neuromuscular re-education, Balance training, Gait training, Patient/Family education, Joint mobilization, Dry Needling, Electrical stimulation, Cryotherapy, Moist heat, Biofeedback, and Manual therapy   PLAN FOR NEXT SESSION: rectally to release anal sphincter and puborectalis if still having pain; sitting on foam noodle or pball releases, DN#3 to lumbar if this helped     [employer], PT 11/05/2021, 7:56 AM

## 2021-11-12 ENCOUNTER — Ambulatory Visit: Payer: BLUE CROSS/BLUE SHIELD | Attending: Hematology | Admitting: Physical Therapy

## 2021-11-12 ENCOUNTER — Encounter: Payer: Self-pay | Admitting: Physical Therapy

## 2021-11-12 DIAGNOSIS — M62838 Other muscle spasm: Secondary | ICD-10-CM | POA: Insufficient documentation

## 2021-11-12 DIAGNOSIS — M6281 Muscle weakness (generalized): Secondary | ICD-10-CM | POA: Diagnosis present

## 2021-11-12 NOTE — Therapy (Signed)
OUTPATIENT PHYSICAL THERAPY TREATMENT NOTE   Patient Name: Kayla Hobbs MRN: [id number] DOB:04-19-1971, 51 y.o., female Today's Date: 11/12/2021  PCP: Patient, No Pcp Per (Inactive) REFERRING PROVIDER: Truitt Merle, MD  END OF SESSION:   PT End of Session - 11/12/21 1232     Visit Number 4    Date for PT Re-Evaluation 11/18/21    Authorization Type [employer]    PT Start Time 1233    PT Stop Time 1313    PT Time Calculation (min) 40 min    Activity Tolerance Patient tolerated treatment well    Behavior During Therapy WFL for tasks assessed/performed               Past Medical History:  Diagnosis Date   Arthritis    Hypertension    Rectal cancer ([place])    Skin cancer    basil top of heasd    Past Surgical History:  Procedure Laterality Date   PARTIAL HYSTERECTOMY  05/2020   due to heavy bleeding   SKIN CANCER EXCISION     Patient Active Problem List   Diagnosis Date Noted   Rectal cancer ([place]) 07/08/2021   Colon cancer screening 05/26/2021   Hematochezia 07/26/2019   Paresthesia 02/23/2019    REFERRING DIAG: C20 (ICD-10-CM) - Rectal cancer ([place])  THERAPY DIAG:  Muscle weakness (generalized)  Other muscle spasm  PERTINENT HISTORY: 3 vaginal deliveries  PRECAUTIONS: none  SUBJECTIVE: Pt is still having pain with bowel movements.    PAIN:  Are you having pain? No    OBJECTIVE: (objective measures completed at initial evaluation unless otherwise dated)    ONSET DATE:  5 weeks ago with radiation    BOWEL MOVEMENT Pain with bowel movement: Yes Type of bowel movement:Type (Bristol Stool Scale) 1-8 and Strain Yes Fully empty rectum: No Leakage: Yes: 1-2 but not a lot     URINATION Pain with urination: Yes Fully empty bladder: Yes:   Stream: Strong Urgency: No Frequency: going every our Leakage:  no Pads: No   INTERCOURSE Pain with intercourse:  Yes Marinoff Scale: 3/3   PREGNANCY Vaginal deliveries 3 Tearing No     OBJECTIVE:       COGNITION:            Overall cognitive status: Within functional limits for tasks assessed                  MUSCLE LENGTH: Hamstrings: Right 60 deg; Left 50 deg       FUNCTIONAL TESTS:  SLS    GAIT:   Comments: decreased step length   POSTURE:  Thoracic kyphosis mild   LUMBARAROM/PROM   A/PROM A/PROM  08/26/2021  Flexion 60%  Extension WFL  Right lateral flexion WFL  Left lateral flexion WFL  Right rotation WFL  Left rotation WFL   (Blank rows = not tested)                          Bil hip PROM - 75% limited throughout and painful    LE MMT:   MMT Right 08/26/2021 Left 08/26/2021  Hip flexion      Hip extension      Hip abduction 4/5 4/5  Hip adduction 5/5 5/5  Hip internal rotation      Hip external rotation      Knee flexion      Knee extension      Ankle dorsiflexion  Ankle plantarflexion      Ankle inversion      Ankle eversion                PALPATION:   General  lumbar and thoracic tight                                              Internal Pelvic Floor - deferred       TODAY'S TREATMENT  Treatment:11/12/21 Exercises  Piriformis 2 ways in sitting  Self care: Educated on using pelvic wand to release puborectalis  Manual Patient confirms identification and approves physical therapist to perform internal soft tissue work  Internal to puborectalis left more than right due to right side very tender and inflamed Lumbar and thoracic MFR; gluteal and lumbar STM  Trigger Point Dry-Needling  Treatment instructions: Expect mild to moderate muscle soreness. S/S of pneumothorax if dry needled over a lung field, and to seek immediate medical attention should they occur. Patient verbalized understanding of these instructions and education.  Patient Consent Given: Yes Education handout provided: Yes Muscles treated: right gluteals Electrical stimulation performed: No Parameters: N/A Treatment response/outcome: increased soft tissue length  palpated and twitch response  Treatment:10/29/21 Exercises  Cat cow Child pose Happy baby Single knee to chest  Manual  Lumbar and thoracic MFR; gluteal and lumbar STM  Trigger Point Dry-Needling  Treatment instructions: Expect mild to moderate muscle soreness. S/S of pneumothorax if dry needled over a lung field, and to seek immediate medical attention should they occur. Patient verbalized understanding of these instructions and education.  Patient Consent Given: Yes Education handout provided: Yes Muscles treated: lumbar and thoracic multifi Electrical stimulation performed: No Parameters: N/A Treatment response/outcome: increased soft tissue length palpated   Nuero Re-ed Education and cues for breathing with stretches  Therapeutic activities  Toileting and initial HEP educated and performed     PATIENT EDUCATION:  Education details: Access Code: [email or web address] Person educated: Patient Education method: Customer service manager Education comprehension: verbalized understanding and returned demonstration     HOME EXERCISE PROGRAM: Access Code: [email or web address] URL: [email or web address] Date: 09/17/2021 Prepared by: Jari Favre  Exercises - Supine Diaphragmatic Breathing  - 3 x daily - 7 x weekly - 1 sets - 10 reps - Supine Lower Trunk Rotation  - 1 x daily - 7 x weekly - 1 sets - 10 reps - 5 sec hold - Thoracic Extension with Noodle/Towel  - 1 x daily - 7 x weekly - 3 sets - 10 reps - Cat Cow to Child's Pose  - 1 x daily - 7 x weekly - 1 sets - 5 reps - 10 sec hold - Quadruped Thoracic Spine Extension  - 1 x daily - 7 x weekly - 3 sets - 10 reps - Supine Pelvic Floor Stretch  - 1 x daily - 7 x weekly - 1 sets - 3 reps - 30 sec hold - Hooklying Single Knee to Chest  - 1 x daily - 7 x weekly - 3 sets - 10 reps ASSESSMENT:   CLINICAL IMPRESSION: Pt did well with soft tissue and dry needling as mentioned above.  Puborectalis muscle was very tight and  had to use light pressure. Pt was educated on ways to increase soft tissue length at home with new stretches demonstrated and performed and education on pelvic wand.   Pt will benefit  from skilled PT to continue working on functional goals and follow up with new information learned today.  OBJECTIVE IMPAIRMENTS decreased activity tolerance, decreased coordination, decreased endurance, decreased ROM, decreased strength, increased fascial restrictions, increased muscle spasms, impaired flexibility, impaired tone, postural dysfunction, and pain.    ACTIVITY LIMITATIONS community activity, driving, occupation, and personal relationship, toileting .    PERSONAL FACTORS 1-2 comorbidities: vaginal deliveries, hysterectomy, rectal cancer with radiation and 3+ comorbidities:    are also affecting patient's functional outcome.      REHAB POTENTIAL: Excellent   CLINICAL DECISION MAKING: Evolving/moderate complexity   EVALUATION COMPLEXITY: Moderate     GOALS: Goals reviewed with patient? Yes   SHORT TERM GOALS: Target date: 09/23/2021   Ind with toileting techniques Baseline: Goal status: met       LONG TERM GOALS: Target date: 11/18/2021   Pt will be independent with advanced HEP to maintain improvements made throughout therapy   Baseline:  Goal status: met   2.  Pt will report 50% reduction of pain during BM to improvements in posture, strength, and muscle length   Baseline:  Goal status: Ongoing   3.  Pt will be able to have a BM without straining Baseline: 10/29/21  Goal status: Met   4.  Pt will be able to fully empty bladder without pain  Baseline: 10/29/21  Goal status: Met       PLAN: PT FREQUENCY: 1x/week   PT DURATION: 12 weeks   PLANNED INTERVENTIONS: Therapeutic exercises, Therapeutic activity, Neuromuscular re-education, Balance training, Gait training, Patient/Family education, Joint mobilization, Dry Needling, Electrical stimulation, Cryotherapy, Moist heat,  Biofeedback, and Manual therapy   PLAN FOR NEXT SESSION: f/u on pelvic wand and dry needling to gluteals; sitting on pool noodle or pball releases, DN#2 and STM to gluteals if this helped     [employer], PT 11/12/2021, 12:33 PM

## 2021-11-18 ENCOUNTER — Inpatient Hospital Stay: Payer: BLUE CROSS/BLUE SHIELD | Attending: Nurse Practitioner | Admitting: Hematology

## 2021-11-18 ENCOUNTER — Encounter: Payer: Self-pay | Admitting: Hematology

## 2021-11-18 ENCOUNTER — Inpatient Hospital Stay: Payer: BLUE CROSS/BLUE SHIELD

## 2021-11-18 ENCOUNTER — Other Ambulatory Visit: Payer: Self-pay

## 2021-11-18 VITALS — BP 133/91 | HR 84 | Temp 98.5°F | Ht 64.0 in | Wt 162.4 lb

## 2021-11-18 DIAGNOSIS — F419 Anxiety disorder, unspecified: Secondary | ICD-10-CM | POA: Insufficient documentation

## 2021-11-18 DIAGNOSIS — C20 Malignant neoplasm of rectum: Secondary | ICD-10-CM | POA: Diagnosis present

## 2021-11-18 DIAGNOSIS — I10 Essential (primary) hypertension: Secondary | ICD-10-CM | POA: Diagnosis not present

## 2021-11-18 DIAGNOSIS — Z9221 Personal history of antineoplastic chemotherapy: Secondary | ICD-10-CM | POA: Insufficient documentation

## 2021-11-18 DIAGNOSIS — Z79899 Other long term (current) drug therapy: Secondary | ICD-10-CM | POA: Diagnosis not present

## 2021-11-18 DIAGNOSIS — F439 Reaction to severe stress, unspecified: Secondary | ICD-10-CM | POA: Insufficient documentation

## 2021-11-18 DIAGNOSIS — K6289 Other specified diseases of anus and rectum: Secondary | ICD-10-CM | POA: Diagnosis not present

## 2021-11-18 LAB — CBC WITH DIFFERENTIAL (CANCER CENTER ONLY)
Abs Immature Granulocytes: 0.01 10*3/uL (ref 0.00–0.07)
Basophils Absolute: 0 10*3/uL (ref 0.0–0.1)
Basophils Relative: 1 %
Eosinophils Absolute: 0.1 10*3/uL (ref 0.0–0.5)
Eosinophils Relative: 2 %
HCT: 43 % (ref 36.0–46.0)
Hemoglobin: 14.5 g/dL (ref 12.0–15.0)
Immature Granulocytes: 0 %
Lymphocytes Relative: 21 %
Lymphs Abs: 0.9 10*3/uL (ref 0.7–4.0)
MCH: 32.8 pg (ref 26.0–34.0)
MCHC: 33.7 g/dL (ref 30.0–36.0)
MCV: 97.3 fL (ref 80.0–100.0)
Monocytes Absolute: 0.6 10*3/uL (ref 0.1–1.0)
Monocytes Relative: 13 %
Neutro Abs: 2.7 10*3/uL (ref 1.7–7.7)
Neutrophils Relative %: 63 %
Platelet Count: 220 10*3/uL (ref 150–400)
RBC: 4.42 MIL/uL (ref 3.87–5.11)
RDW: 12 % (ref 11.5–15.5)
WBC Count: 4.3 10*3/uL (ref 4.0–10.5)
nRBC: 0 % (ref 0.0–0.2)

## 2021-11-18 LAB — CMP (CANCER CENTER ONLY)
ALT: 13 U/L (ref 0–44)
AST: 14 U/L — ABNORMAL LOW (ref 15–41)
Albumin: 4.2 g/dL (ref 3.5–5.0)
Alkaline Phosphatase: 69 U/L (ref 38–126)
Anion gap: 6 (ref 5–15)
BUN: 15 mg/dL (ref 6–20)
CO2: 29 mmol/L (ref 22–32)
Calcium: 9.5 mg/dL (ref 8.9–10.3)
Chloride: 105 mmol/L (ref 98–111)
Creatinine: 0.98 mg/dL (ref 0.44–1.00)
GFR, Estimated: >60 mL/min (ref >60)
Glucose, Bld: 76 mg/dL (ref 70–99)
Potassium: 4 mmol/L (ref 3.5–5.1)
Sodium: 140 mmol/L (ref 135–145)
Total Bilirubin: 0.3 mg/dL (ref 0.3–1.2)
Total Protein: 7 g/dL (ref 6.5–8.1)

## 2021-11-18 LAB — CEA (IN HOUSE-CHCC): CEA (CHCC-In House): 2.73 ng/mL (ref 0.00–5.00)

## 2021-11-18 NOTE — Progress Notes (Signed)
[place]   [phone] [phone]   Clinic Follow up Note   Patient Care Team: Patient, No Pcp Per as PCP - General (General Practice) Leighton Ruff, MD as Consulting Physician (General Surgery) Truitt Merle, MD as Consulting Physician (Hematology) Royston Bake, RN as Oncology Nurse Navigator (Oncology) Alla Feeling, NP as Nurse Practitioner (Nurse Practitioner)  Date of Service:  11/18/2021  CHIEF COMPLAINT: f/u of rectal cancer  CURRENT THERAPY:  Surveillance  ASSESSMENT & PLAN:  Kayla Hobbs is a 51 y.o. female with   1. Moderately differentiated rectal adenocarcinoma, G2, cT2N0M0 stage I -presented with rectal bleeding in 07/2019, but patient cancelled colonoscopy at that time. She had persistent bleeding and presented back to GI 06/2021, work up showed a distal rectal polyp palpable on DRE. Colonoscopy on 06/18/21 by Dr. Havery [name] showed a ~3 cm polyp, path revealed moderately differentiated adenocarcinoma invading submucosa to a dept of 8 mm, with a close (0.1 cm) but negative margin.   -Staging CT CAP on 07/02/21 was negative for local regional adenopathy or distant metastasis. -Staging pelvic MRI on 07/03/21 showed local inflammation but no identifiable lesion. -MMR testing on biopsy was normal. -she received concurrent chemoRT 07/15/21 - 09/02/21 with Xeloda 1500 mg BID and radiation under Dr. Lisbeth Renshaw. She tolerated treatment well with no significant side effects, except local irritation. -She has recovered well for most part, still has some rectal discomfort and vaginal tightness, I encouraged her to continue pelvic PT and follow-up with gynecologist. -She is scheduled for sigmoidoscopy next week, and a follow-up with Dr. Marcello Moores in 1 months -Follow-up with me in 3 months with lab and pelvic MRI for surveillance in 3 months   2. Anxiety, Stress -she reports worsening memory and anxiety since diagnosis. -I discussed we have a counselor, Dr.  Michail Sermon, that I could refer her to if she would like. -I will have our social worker reach out to her.   3. HTN -on HCTZ     4. Genetics -patient had genetic testing through [facility], reportedly negative.  We will request the report -Patient has 3 healthy daughters, oldest is 58.  Recommendation is for them to begin colorectal cancer screening at age 55 (19 years younger than patient at her diagnosis), or sooner if they have for personal red flags     PLAN: -overall doing well -f/u in 3 months with lab and pelvic MRI   No problem-specific Assessment & Plan notes found for this encounter.   SUMMARY OF ONCOLOGIC HISTORY: Oncology History  Rectal cancer ([place])  06/18/2021 Cancer Staging   Staging form: Colon and Rectum, AJCC 8th Edition - Clinical stage from 06/18/2021: Stage I (cT1, cN0, cM0) - Signed by Truitt Merle, MD on 07/11/2021 Stage prefix: Initial diagnosis Total positive nodes: 0 Histologic grade (G): G2 Histologic grading system: 4 grade system   06/18/2021 Procedure   Colonoscopy per Dr. Havery [name], impression - Polypoid lesion found on digital rectal exam. - One diminutive polyp in the ascending colon, removed with a cold biopsy forceps. Resected and retrieved. - One 5 mm polyp in the sigmoid colon, removed with a cold snare. Resected and retrieved. - One large polyp in the distal rectum. Resected and retrieved as outlined. Injected/tattooed. Clips were placed. - Polypoid lesions in the distal rectum. Biopsied. - Internal hemorrhoids. - The examination was otherwise normal.   06/18/2021 Initial Biopsy   Diagnosis 1. Surgical [P], colon, distal rectal biopsy - CONDYLOMA ACUMINATA 2. Surgical [  P], colon, ascending, sigmoid, polyp (2) - TUBULAR ADENOMA (2 OF 2 FRAGMENTS) - NO HIGH-GRADE DYSPLASIA OR MALIGNANCY IDENTIFIED 3. Surgical [P], colon, rectum, polyp (1) - ADENOCARCINOMA ARISING IN A TUBULAR ADENOMA WITH HIGH-GRADE DYSPLASIA - SEE  COMMENT Microscopic Comment 3. The invasive adenocarcinoma appears moderately differentiated and invades the submucosa for a depth of 0.8 cm. The adenocarcinoma comes within 0.1 cm from the base of the polyp. High-grade features including lymphovascular space invasion, tumor budding or poor differentiation are not identified. Dr. Vic Ripper reviewed the case and agrees with the above diagnosis.   07/02/2021 Imaging   CT CAP IMPRESSION: Negative. No evidence of metastatic disease or other significant abnormality.   07/03/2021 Imaging   Pelvic MRI w/wo contrast IMPRESSION: Rectal adenocarcinoma T stage: T2.  Lesion not identified  Rectal adenocarcinoma N stage:  N0  Distance from tumor to the internal anal sphincter. Lesion not identified   07/08/2021 Initial Diagnosis   Rectal cancer [facility])      INTERVAL HISTORY:  Kayla Hobbs is here for a follow up of rectal cancer. She was last seen by me 2 months ago. She still has some rectal pain with BM, and occasional blood in stool. No abd pain,. No n/v. Eating well, gained a few lbs. She is still on pelvic PT.    All other systems were reviewed with the patient and are negative.  MEDICAL HISTORY:  Past Medical History:  Diagnosis Date   Arthritis    Hypertension    Rectal cancer ([place])    Skin cancer    basil top of heasd     SURGICAL HISTORY: Past Surgical History:  Procedure Laterality Date   PARTIAL HYSTERECTOMY  05/2020   due to heavy bleeding   SKIN CANCER EXCISION      I have reviewed the social history and family history with the patient and they are unchanged from previous note.  ALLERGIES:  has No Known Allergies.  MEDICATIONS:  Current Outpatient Medications  Medication Sig Dispense Refill   buPROPion (WELLBUTRIN SR) 150 MG 12 hr tablet Take 150 mg by mouth 2 (two) times daily.     busPIRone (BUSPAR) 15 MG tablet Take 15 mg by mouth daily in the afternoon.     estradiol (ESTRACE) 0.5 MG tablet Take 0.5 mg by mouth  daily.     fluticasone (FLONASE) 50 MCG/ACT nasal spray Place 2 sprays into both nostrils daily. 16 g 0   KLOR-CON M20 20 MEQ tablet TAKE 1 TABLET BY MOUTH TWICE A DAY (Patient not taking: Reported on 10/13/2021) 180 tablet 1   SUMAtriptan (IMITREX) 50 MG tablet Take 50 mg by mouth daily as needed. (Patient not taking: Reported on 07/08/2021)     No current facility-administered medications for this visit.    PHYSICAL EXAMINATION: ECOG PERFORMANCE STATUS: 1 - Symptomatic but completely ambulatory  Vitals:   11/18/21 1317  BP: (!) 133/91  Pulse: 84  Temp: 98.5 F (36.9 C)  SpO2: 97%    Wt Readings from Last 3 Encounters:  11/18/21 162 lb 6.4 oz (73.7 kg)  10/13/21 157 lb 2 oz (71.3 kg)  09/17/21 157 lb (71.2 kg)     GENERAL:alert, no distress and comfortable SKIN: skin color normal, no rashes or significant lesions EYES: normal, Conjunctiva are pink and non-injected, sclera clear  NEURO: alert & oriented x 3 with fluent speech RECTAL, external only: Mild skin hyperpigmentation, no ulcers or skin lesion. (+) External hemorrhoid.  LABORATORY DATA:  I have reviewed the data  as listed    Latest Ref Rng & Units 11/18/2021   12:55 PM 09/17/2021   11:47 AM 09/01/2021    9:49 AM  CBC  WBC 4.0 - 10.5 K/uL 4.3  4.1  4.9   Hemoglobin 12.0 - 15.0 g/dL 14.5  13.4  13.3   Hematocrit 36.0 - 46.0 % 43.0  37.7  38.7   Platelets 150 - 400 K/uL 220  177  195         Latest Ref Rng & Units 11/18/2021   12:55 PM 09/17/2021   11:47 AM 09/01/2021    9:49 AM  CMP  Glucose 70 - 99 mg/dL 76  104  89   BUN 6 - 20 mg/dL 15  13  12    Creatinine 0.44 - 1.00 mg/dL 0.98  0.98  0.99   Sodium 135 - 145 mmol/L 140  140  140   Potassium 3.5 - 5.1 mmol/L 4.0  2.8  3.0   Chloride 98 - 111 mmol/L 105  100  104   CO2 22 - 32 mmol/L 29  34  33   Calcium 8.9 - 10.3 mg/dL 9.5  9.2  9.1   Total Protein 6.5 - 8.1 g/dL 7.0  6.8  6.5   Total Bilirubin 0.3 - 1.2 mg/dL 0.3  0.5  0.6   Alkaline Phos 38 - 126 U/L  69  79  73   AST 15 - 41 U/L 14  12  13    ALT 0 - 44 U/L 13  9  9        RADIOGRAPHIC STUDIES: I have personally reviewed the radiological images as listed and agreed with the findings in the report. No results found.    Orders Placed This Encounter  Procedures   MR PELVIS WO CM RECTAL CA STAGING    Standing Status:   Future    Standing Expiration Date:   11/19/2022    Order Specific Question:   If indicated for the ordered procedure, I authorize the administration of contrast media per Radiology protocol    Answer:   Yes    Order Specific Question:   What is the patient's sedation requirement?    Answer:   No Sedation    Order Specific Question:   Does the patient have a pacemaker or implanted devices?    Answer:   No    Order Specific Question:   Preferred imaging location?    Answer:   [facility] (table limit - 550 lbs)   All questions were answered. The patient knows to call the clinic with any problems, questions or concerns. No barriers to learning was detected. The total time spent in the appointment was 30 minutes.     Truitt Merle, MD 11/18/2021

## 2021-11-19 ENCOUNTER — Encounter: Payer: Self-pay | Admitting: Physical Therapy

## 2021-11-19 ENCOUNTER — Ambulatory Visit: Payer: BLUE CROSS/BLUE SHIELD | Admitting: Physical Therapy

## 2021-11-19 ENCOUNTER — Telehealth: Payer: Self-pay | Admitting: Hematology

## 2021-11-19 DIAGNOSIS — M6281 Muscle weakness (generalized): Secondary | ICD-10-CM

## 2021-11-19 DIAGNOSIS — M62838 Other muscle spasm: Secondary | ICD-10-CM

## 2021-11-19 NOTE — Telephone Encounter (Signed)
Scheduled follow-up appointment per 7/12 los. Patient is aware.

## 2021-11-19 NOTE — Therapy (Signed)
OUTPATIENT PHYSICAL THERAPY TREATMENT NOTE   Patient Name: Kayla Hobbs MRN: [id number] DOB:[birth year], 51 y.o., female Today's Date: 11/19/2021  PCP: Patient, No Pcp Per (Inactive) REFERRING PROVIDER: Truitt Merle, MD  END OF SESSION:   PT End of Session - 11/19/21 1352     Visit Number 5    Date for PT Re-Evaluation 01/14/22    Authorization Type BCBS    PT Start Time 1740    PT Stop Time 1310    PT Time Calculation (min) 35 min    Activity Tolerance Patient tolerated treatment well    Behavior During Therapy WFL for tasks assessed/performed                Past Medical History:  Diagnosis Date   Arthritis    Hypertension    Rectal cancer ([place])    Skin cancer    basil top of heasd    Past Surgical History:  Procedure Laterality Date   PARTIAL HYSTERECTOMY  05/2020   due to heavy bleeding   SKIN CANCER EXCISION     Patient Active Problem List   Diagnosis Date Noted   Rectal cancer ([place]) 07/08/2021   Colon cancer screening 05/26/2021   Hematochezia 07/26/2019   Paresthesia 02/23/2019    REFERRING DIAG: C20 (ICD-10-CM) - Rectal cancer ([place])  THERAPY DIAG:  Muscle weakness (generalized)  Other muscle spasm  PERTINENT HISTORY: 3 vaginal deliveries  PRECAUTIONS: none  SUBJECTIVE: Pt is still having pain with bowel movements and a little bleeding the past couple of days.  Pt   said pain is 8/10 just when bowel movement is evacuating and is feeling very sore today due to been having more bleeding.  Initially after dry needling it seems a little better.  I haven't purchased the wand yet  PAIN:  Are you having pain? No    OBJECTIVE: (objective measures completed at initial evaluation unless otherwise dated)    ONSET DATE:  5 weeks ago with radiation    BOWEL MOVEMENT Pain with bowel movement: Yes Type of bowel movement:Type (Bristol Stool Scale) 1-8 and Strain Yes Fully empty rectum: No Leakage: Yes: 1-2 but not a lot     URINATION Pain with  urination: Yes Fully empty bladder: Yes:   Stream: Strong Urgency: No Frequency: going every our Leakage:  no Pads: No   INTERCOURSE Pain with intercourse:  Yes Marinoff Scale: 3/3   PREGNANCY Vaginal deliveries 3 Tearing No     OBJECTIVE:      COGNITION:            Overall cognitive status: Within functional limits for tasks assessed                  MUSCLE LENGTH: Hamstrings: Right 60 deg; Left 50 deg       FUNCTIONAL TESTS:  SLS    GAIT:   Comments: decreased step length   POSTURE:  Thoracic kyphosis mild   LUMBARAROM/PROM   A/PROM A/PROM  08/26/2021  Flexion 60%  Extension WFL  Right lateral flexion WFL  Left lateral flexion WFL  Right rotation WFL  Left rotation WFL   (Blank rows = not tested)                          Bil hip PROM - 75% limited throughout and painful    LE MMT:   MMT Right 08/26/2021 Left 08/26/2021  Hip flexion      Hip extension  Hip abduction 4/5 4/5  Hip adduction 5/5 5/5  Hip internal rotation      Hip external rotation      Knee flexion      Knee extension      Ankle dorsiflexion      Ankle plantarflexion      Ankle inversion      Ankle eversion                PALPATION:   General  lumbar and thoracic tight                                              Internal Pelvic Floor - deferred       TODAY'S TREATMENT  Treatment:11/19/21 Exercises  Sitting on pball and doing circles to release pelvic floor Sitting on foam noodle for pressure release  Manual Patient will not tolerate anything internal today Internal to puborectalis left more than right due to right side very tender and inflamed Lumbar and thoracic MFR; gluteal and lumbar STM  Trigger Point Dry-Needling  Treatment instructions: Expect mild to moderate muscle soreness. S/S of pneumothorax if dry needled over a lung field, and to seek immediate medical attention should they occur. Patient verbalized understanding of these instructions and  education.  Patient Consent Given: Yes Education handout provided: Yes Muscles treated: right gluteals Electrical stimulation performed: No Parameters: N/A Treatment response/outcome: increased soft tissue length palpated and twitch response  Treatment:11/12/21 Exercises  Piriformis 2 ways in sitting  Self care: Educated on using pelvic wand to release puborectalis  Manual Patient confirms identification and approves physical therapist to perform internal soft tissue work  Internal to puborectalis left more than right due to right side very tender and inflamed Lumbar and thoracic MFR; gluteal and lumbar STM  Trigger Point Dry-Needling  Treatment instructions: Expect mild to moderate muscle soreness. S/S of pneumothorax if dry needled over a lung field, and to seek immediate medical attention should they occur. Patient verbalized understanding of these instructions and education.  Patient Consent Given: Yes Education handout provided: Yes Muscles treated: right gluteals Electrical stimulation performed: No Parameters: N/A Treatment response/outcome: increased soft tissue length palpated and twitch response  Treatment:10/29/21 Exercises  Cat cow Child pose Happy baby Single knee to chest  Manual  Lumbar and thoracic MFR; gluteal and lumbar STM  Trigger Point Dry-Needling  Treatment instructions: Expect mild to moderate muscle soreness. S/S of pneumothorax if dry needled over a lung field, and to seek immediate medical attention should they occur. Patient verbalized understanding of these instructions and education.  Patient Consent Given: Yes Education handout provided: Yes Muscles treated: lumbar and thoracic multifi Electrical stimulation performed: No Parameters: N/A Treatment response/outcome: increased soft tissue length palpated   Nuero Re-ed Education and cues for breathing with stretches  Therapeutic activities  Toileting and initial HEP educated and  performed     PATIENT EDUCATION:  Education details: Access Code: [email or web address] Person educated: Patient Education method: Customer service manager Education comprehension: verbalized understanding and returned demonstration     HOME EXERCISE PROGRAM: Access Code: [email or web address] URL: [email or web address] Date: 09/17/2021 Prepared by: Jari Favre  Exercises - Supine Diaphragmatic Breathing  - 3 x daily - 7 x weekly - 1 sets - 10 reps - Supine Lower Trunk Rotation  - 1 x daily - 7 x weekly - 1 sets - 10 reps - 5  sec hold - Thoracic Extension with Noodle/Towel  - 1 x daily - 7 x weekly - 3 sets - 10 reps - Cat Cow to Child's Pose  - 1 x daily - 7 x weekly - 1 sets - 5 reps - 10 sec hold - Quadruped Thoracic Spine Extension  - 1 x daily - 7 x weekly - 3 sets - 10 reps - Supine Pelvic Floor Stretch  - 1 x daily - 7 x weekly - 1 sets - 3 reps - 30 sec hold - Hooklying Single Knee to Chest  - 1 x daily - 7 x weekly - 3 sets - 10 reps ASSESSMENT:   CLINICAL IMPRESSION: Pt reports she felt there was a little less straining.  Pt was unable to tolerate internal assessment today due to pain.  Pt has still not had visits for reviewing wand and other tools that may be helpful.  She is also still making progress with manual techniques in the clinic.  All goals were updated today and pt is expected to continue to make progress towards functional goals.  OBJECTIVE IMPAIRMENTS decreased activity tolerance, decreased coordination, decreased endurance, decreased ROM, decreased strength, increased fascial restrictions, increased muscle spasms, impaired flexibility, impaired tone, postural dysfunction, and pain.    ACTIVITY LIMITATIONS community activity, driving, occupation, and personal relationship, toileting .    PERSONAL FACTORS 1-2 comorbidities: vaginal deliveries, hysterectomy, rectal cancer with radiation and 3+ comorbidities:    are also affecting patient's functional outcome.       REHAB POTENTIAL: Excellent   CLINICAL DECISION MAKING: Evolving/moderate complexity   EVALUATION COMPLEXITY: Moderate     GOALS: Goals reviewed with patient? Yes   SHORT TERM GOALS: Target date: 09/23/2021   Ind with toileting techniques Baseline: Goal status: met       LONG TERM GOALS: Target date: 01/14/22  updated 11/19/21  Pt will be independent with advanced HEP to maintain improvements made throughout therapy   Baseline:  Goal status: ongoing   2.  Pt will report 50% reduction of pain during BM to improvements in posture, strength, and muscle length   Baseline: a little better, did not give number Goal status: Ongoing   3.  Pt will be able to have a BM without straining Baseline:   Goal status: Met   4.  Pt will be able to fully empty bladder without pain  Baseline: 10/29/21  Goal status: Met  5. Pt will understand how to use the wand to release muscles in the pelvic floor for reduced pain with intercourse and bowel movements   Baseline: painful up to 8/10  Goal status: NEW     PLAN: PT FREQUENCY: 1x/week   PT DURATION: 8 weeks   PLANNED INTERVENTIONS: Therapeutic exercises, Therapeutic activity, Neuromuscular re-education, Balance training, Gait training, Patient/Family education, Joint mobilization, Dry Needling, Electrical stimulation, Cryotherapy, Moist heat, Biofeedback, and Manual therapy   PLAN FOR NEXT SESSION: f/u on pelvic wand and visit to re-assess rectum and gym visit if she has seen; assess and treat pelvic floor vaginally if patient is comfortable with that     [employer], PT 11/19/2021, 1:53 PM

## 2021-11-25 ENCOUNTER — Ambulatory Visit (AMBULATORY_SURGERY_CENTER): Payer: BLUE CROSS/BLUE SHIELD | Admitting: Gastroenterology

## 2021-11-25 ENCOUNTER — Encounter: Payer: Self-pay | Admitting: Gastroenterology

## 2021-11-25 VITALS — BP 135/83 | HR 72 | Temp 97.3°F | Resp 10 | Ht 64.0 in | Wt 157.0 lb

## 2021-11-25 DIAGNOSIS — C20 Malignant neoplasm of rectum: Secondary | ICD-10-CM

## 2021-11-25 DIAGNOSIS — K6289 Other specified diseases of anus and rectum: Secondary | ICD-10-CM

## 2021-11-25 MED ORDER — SODIUM CHLORIDE 0.9 % IV SOLN: 500.0000 mL | Freq: Once | INTRAVENOUS | Status: DC

## 2021-11-25 MED ORDER — AMBULATORY NON FORMULARY MEDICATION: 30.0000 g | Freq: Three times a day (TID) | 0 refills | Status: DC | PRN

## 2021-11-25 NOTE — Progress Notes (Signed)
PT taken to PACU. Monitors in place. VSS. Report given to RN. 

## 2021-11-25 NOTE — Patient Instructions (Signed)
Resume previous diet and medications. Start Citrucel once daily and Diltiazem/Lidocaine Ointment to treat fissure. Pea sized amount three times daily for a few weeks. Continue to follow up with Dr. Angelia Mould. Kayla Hobbs  Kayla HAD AN ENDOSCOPIC PROCEDURE TODAY AT THE [facility]:   Refer to the procedure report that was given to Kayla for any specific questions about what was found during the examination.  If the procedure report does not answer your questions, please call your gastroenterologist to clarify.  If Kayla requested that your care partner not be given the details of your procedure findings, then the procedure report has been included in a sealed envelope for Kayla to review at your convenience later.  Kayla SHOULD EXPECT: Some feelings of bloating in the abdomen. Passage of more gas than usual.  Walking can help get rid of the air that was put into your GI tract during the procedure and reduce the bloating. If Kayla had a lower endoscopy (such as a colonoscopy or flexible sigmoidoscopy) Kayla may notice spotting of blood in your stool or on the toilet paper. If Kayla underwent a bowel prep for your procedure, Kayla may not have a normal bowel movement for a few days.  Please Note:  Kayla might notice some irritation and congestion in your nose or some drainage.  This is from the oxygen used during your procedure.  There is no need for concern and it should clear up in a day or so.  SYMPTOMS TO REPORT IMMEDIATELY:  Following lower endoscopy (colonoscopy or flexible sigmoidoscopy):  Excessive amounts of blood in the stool  Significant tenderness or worsening of abdominal pains  Swelling of the abdomen that is new, acute  Fever of 100F or higher  For urgent or emergent issues, a gastroenterologist can be reached at any hour by calling [phone]. Do not use MyChart messaging for urgent concerns.    DIET:  We do recommend a small meal at first, but then Kayla may proceed to your regular diet.   Drink plenty of fluids but Kayla should avoid alcoholic beverages for 24 hours.  ACTIVITY:  Kayla should plan to take it easy for the rest of today and Kayla should NOT DRIVE or use heavy machinery until tomorrow (because of the sedation medicines used during the test).    FOLLOW UP: Our staff will call the number listed on your records the next business day following your procedure.  We will call around 7:15- 8:00 am to check on Kayla and address any questions or concerns that Kayla may have regarding the information given to Kayla following your procedure. If we do not reach Kayla, we will leave a message.  If Kayla develop any symptoms (ie: fever, flu-like symptoms, shortness of breath, cough etc.) before then, please call [phone].  If Kayla test positive for Covid 19 in the 2 weeks post procedure, please call and report this information to [place].    If any biopsies were taken Kayla will be contacted by phone or by letter within the next 1-3 weeks.  Please call us at [phone] if Kayla have not heard about the biopsies in 3 weeks.    SIGNATURES/CONFIDENTIALITY: Kayla and/or your care partner have signed paperwork which will be entered into your electronic medical record.  These signatures attest to the fact that that the information above on your After Visit Summary has been reviewed and is understood.  Full responsibility of the confidentiality of this discharge information lies with Kayla and/or your care-partner.

## 2021-11-25 NOTE — Progress Notes (Signed)
Pt's states no medical or surgical changes since previsit or office visit. 

## 2021-11-25 NOTE — Op Note (Signed)
[facility] Patient Name: Kayla Hobbs Procedure Date: 11/25/2021 10:40 AM MRN: [id number] Endoscopist: Remo Lipps P. Havery [name] , MD Age: 51 Referring MD:  Date of Birth: Oct 20, 1970 Gender: Female Account #: [id number] Procedure:                Flexible Sigmoidoscopy Indications:              High risk colon cancer surveillance: Personal                            history of rectal cancer in February 2023, removed                            endoscopically with EMR, now s/p XRT and                            chemotherapy. Also with history of anorectal                            condyloma. She does endorse some rectal discomfort                            with bowel movements. Medicines:                Monitored Anesthesia Care Procedure:                Pre-Anesthesia Assessment:                           - Prior to the procedure, a History and Physical                            was performed, and patient medications and                            allergies were reviewed. [name] tolerance of                            previous anesthesia was also reviewed. The risks                            and benefits of the procedure and the sedation                            options and risks were discussed with the patient.                            All questions were answered, and informed consent                            was obtained. Prior Anticoagulants: The patient has                            taken no previous anticoagulant or antiplatelet  agents. ASA Grade Assessment: II - A patient with                            mild systemic disease. After reviewing the risks                            and benefits, the patient was deemed in                            satisfactory condition to undergo the procedure.                           After obtaining informed consent, the scope was                            passed under direct vision. The GIF  [id number]                            was introduced through the anus and advanced to the                            the sigmoid colon. The flexible sigmoidoscopy was                            accomplished without difficulty. The patient                            tolerated the procedure well. The quality of the                            bowel preparation was adequate. Scope In: 10:49:42 AM Scope Out: 10:54:50 AM Total Procedure Duration: 0 hours 5 minutes 8 seconds  Findings:                 A small anal fissure was found on perianal exam.                           The rectum and sigmoid colon appeared generally                            normal.                           A medium post polypectomy scar was found in the                            rectum. The scar tissue was healthy in appearance.                            There was no evidence of the previous lesion.                           Internal hemorrhoids were found during  retroflexion. The hemorrhoids were small.                           The exam was otherwise without abnormality. No                            overt condyloma appreciated. Complications:            No immediate complications. Estimated blood loss:                            None. Estimated Blood Loss:     Estimated blood loss: none. Impression:               - Small anal fissure found on perianal exam - may                            be the cause of rectal pain.                           - The rectum and sigmoid colon are normal.                           - Post-polypectomy scar in the rectum.                           - Internal hemorrhoids.                           - The examination was otherwise normal.                           - No obvious residual condyloma. Recommendation:           - Discharge patient to home.                           - Advance diet as tolerated.                           - Repeat full colonoscopy in  February 2024 for                            surveillance.                           - Resume medications                           - Start citrucel once daily                           - Start diltiazem / lidocaine ointment to treat                            fissure - pea sized amount PR three times daily for  a few weeks                           - Continue to follow up with Dr. Burr Medico / Dr. Shon Hough P. Kayla Haselton, MD 11/25/2021 11:02:33 AM This report has been signed electronically.

## 2021-11-25 NOTE — Progress Notes (Signed)
[facility] History and Physical   Primary Care Physician:  Patient, No Pcp Per   Reason for Procedure:   History of rectal cancer  Plan:    Flex sig     HPI: Kayla Hobbs is a 51 y.o. female  here for flex sig to survey rectal cancer. Removed endoscopically in February, now s/p chemo and XRT. Here for close follow up surveillance flex sig. She does have some rectal pain with bowel movements. Otherwise feels well without any cardiopulmonary symptoms.    Past Medical History:  Diagnosis Date   Arthritis    Hypertension    Rectal cancer ([place])    Skin cancer    basil top of heasd     Past Surgical History:  Procedure Laterality Date   PARTIAL HYSTERECTOMY  05/2020   due to heavy bleeding   SKIN CANCER EXCISION      Prior to Admission medications   Medication Sig Start Date End Date Taking? Authorizing Provider  buPROPion (WELLBUTRIN SR) 150 MG 12 hr tablet Take 150 mg by mouth 2 (two) times daily.   Yes [provider]  busPIRone (BUSPAR) 15 MG tablet Take 15 mg by mouth daily in the afternoon. 08/07/21  Yes [provider]  estradiol (ESTRACE) 0.5 MG tablet Take 0.5 mg by mouth daily.   Yes [provider]  fluticasone (FLONASE) 50 MCG/ACT nasal spray Place 2 sprays into both nostrils daily. 07/21/21   Brunetta Jeans, PA-C  KLOR-CON M20 20 MEQ tablet TAKE 1 TABLET BY MOUTH TWICE A DAY Patient not taking: Reported on 10/13/2021 09/16/21   Truitt Merle, MD  SUMAtriptan (IMITREX) 50 MG tablet Take 50 mg by mouth daily as needed. Patient not taking: Reported on 07/08/2021 05/26/21   [provider]    Current Outpatient Medications  Medication Sig Dispense Refill   buPROPion (WELLBUTRIN SR) 150 MG 12 hr tablet Take 150 mg by mouth 2 (two) times daily.     busPIRone (BUSPAR) 15 MG tablet Take 15 mg by mouth daily in the afternoon.     estradiol (ESTRACE) 0.5 MG tablet Take 0.5 mg by mouth daily.     fluticasone (FLONASE) 50 MCG/ACT  nasal spray Place 2 sprays into both nostrils daily. 16 g 0   KLOR-CON M20 20 MEQ tablet TAKE 1 TABLET BY MOUTH TWICE A DAY (Patient not taking: Reported on 10/13/2021) 180 tablet 1   SUMAtriptan (IMITREX) 50 MG tablet Take 50 mg by mouth daily as needed. (Patient not taking: Reported on 07/08/2021)     Current Facility-Administered Medications  Medication Dose Route Frequency Provider Last Rate Last Admin   0.9 %  sodium chloride infusion  500 mL Intravenous Once Conn Trombetta, Carlota Raspberry, MD        Allergies as of 11/25/2021   (No Known Allergies)    Family History  Problem Relation Age of Onset   Heart disease Father    Cancer Brother        liver cancer possibly [place]   Cancer Maternal Grandmother        pancreas   Pancreatic cancer Maternal Grandmother    Diabetes Paternal Grandmother    Heart disease Paternal Grandfather    Cancer Cousin        leukemia   Colon cancer Neg Hx    Stomach cancer Neg Hx    Esophageal cancer Neg Hx     Social History   Socioeconomic History   Marital status: Married    Spouse  name: Not on file   Number of children: 3   Years of education: Not on file   Highest education level: Not on file  Occupational History   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  Tobacco Use   Smoking status: Former    Types: Cigarettes   Smokeless tobacco: Never   Tobacco comments:    Smoked intermittently up to 5 cig per day for 10-15 years, quit 5 years ago  Vaping Use   Vaping Use: Never used  Substance and Sexual Activity   Alcohol use: Yes    Comment: wine, daily   Drug use: Never   Sexual activity: Yes  Other Topics Concern   Not on file  Social History Narrative   Not on file   Social Determinants of Health   Financial Resource Strain: Not on file  Food Insecurity: Not on file  Transportation Needs: Not on file  Physical Activity: Not on file  Stress: Not on file  Social Connections: Not on file  Intimate Partner Violence: Not At Risk (07/15/2021)    Humiliation, Afraid, Rape, and Kick questionnaire    Fear of Current or Ex-Partner: No    Emotionally Abused: No    Physically Abused: No    Sexually Abused: No    Review of Systems: All other review of systems negative except as mentioned in the HPI.  Physical Exam: Vital signs BP (!) 135/91   Pulse 77   Temp (!) 97.3 F (36.3 C) (Temporal)   Ht '5\' 4"'$  (1.626 m)   Wt 157 lb (71.2 kg)   SpO2 97%   BMI 26.95 kg/m   General:   Alert,  Well-developed, pleasant and cooperative in NAD Lungs:  Clear throughout to auscultation.   Heart:  Regular rate and rhythm Abdomen:  Soft, nontender and nondistended.   Neuro/Psych:  Alert and cooperative. Normal mood and affect. A and O x 3  Jolly Mango, MD [facility]

## 2021-11-26 ENCOUNTER — Telehealth: Payer: Self-pay

## 2021-11-26 NOTE — Telephone Encounter (Signed)
No answer, left message to call if having any issues or concerns, B.Jibran Crookshanks RN 

## 2021-12-03 ENCOUNTER — Ambulatory Visit: Payer: BLUE CROSS/BLUE SHIELD | Admitting: Physical Therapy

## 2021-12-03 ENCOUNTER — Encounter: Payer: Self-pay | Admitting: Physical Therapy

## 2021-12-03 DIAGNOSIS — M6281 Muscle weakness (generalized): Secondary | ICD-10-CM

## 2021-12-03 DIAGNOSIS — M62838 Other muscle spasm: Secondary | ICD-10-CM

## 2021-12-03 NOTE — Patient Instructions (Signed)
STRETCHING THE PELVIC FLOOR MUSCLES NO DILATOR  Supplies Vaginal moisturizer Mirror (optional) clean hands Positioning Start in a semi-reclined position with your head propped up. Bend your knees and place your thumb or finger at the vaginal opening. Procedure Apply a moderate amount of lubricant on the outer skin of your vagina, the labia minora.  Apply additional lubricant to your finger. Spread the skin away from the vaginal opening. Place the end of your finger at the opening. Do a maximum contraction of the pelvic floor muscles. Tighten the vagina and the anus maximally and relax. When you know they are relaxed, gently and slowly insert your finger into your vagina, directing your finger slightly downward, for 2-3 inches of insertion. Relax and stretch the 6 o'clock position Hold each stretch for _30-60 seconds, no pain more than 3/10 Repeat the stretching in the 4 o'clock and 8 o'clock positions. Next gently move your finger in a "U" shape  several times.  You can also enter a second finger to work to spread the vaginal opening wider from 3:00-6:00 and 6:00-9:00 or 3:00-9:00 Perform daily or every other day Once you have accomplished the techniques you may try them in standing with one foot resting on the tub, or in other positions.  This is a good stretch to do in the shower if you don't need to use lubricant.    Use vitamin E oil; coconut oil; v-magic

## 2021-12-03 NOTE — Therapy (Signed)
OUTPATIENT PHYSICAL THERAPY TREATMENT NOTE   Patient Name: Kayla Hobbs MRN: [id number] DOB:02/23/1971, 51 y.o., female Today's Date: 12/03/2021  PCP: Patient, No Pcp Per (Inactive) REFERRING PROVIDER: Truitt Merle, MD  END OF SESSION:   PT End of Session - 12/03/21 1237     Visit Number 6    Date for PT Re-Evaluation 01/14/22    Authorization Type BCBS    PT Start Time 1233    PT Stop Time 1303    PT Time Calculation (min) 30 min    Activity Tolerance Patient tolerated treatment well    Behavior During Therapy WFL for tasks assessed/performed                 Past Medical History:  Diagnosis Date   Arthritis    Hypertension    Rectal cancer ([place])    Skin cancer    basil top of heasd    Past Surgical History:  Procedure Laterality Date   PARTIAL HYSTERECTOMY  05/2020   due to heavy bleeding   SKIN CANCER EXCISION     Patient Active Problem List   Diagnosis Date Noted   Rectal cancer ([place]) 07/08/2021   Colon cancer screening 05/26/2021   Hematochezia 07/26/2019   Paresthesia 02/23/2019    REFERRING DIAG: C20 (ICD-10-CM) - Rectal cancer ([place])  THERAPY DIAG:  Muscle weakness (generalized)  Other muscle spasm  PERTINENT HISTORY: 3 vaginal deliveries  PRECAUTIONS: none  SUBJECTIVE: Pt found out she has a fissure.  No cancer was found.  Pt still having pain with intercourse, but wants to see what gyne says.  Feels like she is doing well with all PT at this time.  PAIN:  Are you having pain? No    OBJECTIVE: (objective measures completed at initial evaluation unless otherwise dated)    ONSET DATE:  5 weeks ago with radiation    BOWEL MOVEMENT Pain with bowel movement: Yes Type of bowel movement:Type (Bristol Stool Scale) 1-8 and Strain Yes Fully empty rectum: No Leakage: Yes: 1-2 but not a lot     URINATION Pain with urination: Yes Fully empty bladder: Yes:   Stream: Strong Urgency: No Frequency: going every our Leakage:  no Pads:  No   INTERCOURSE Pain with intercourse:  Yes Marinoff Scale: 3/3   PREGNANCY Vaginal deliveries 3 Tearing No     OBJECTIVE:      COGNITION:            Overall cognitive status: Within functional limits for tasks assessed                  MUSCLE LENGTH: Hamstrings: Right 60 deg; Left 50 deg       FUNCTIONAL TESTS:  SLS    GAIT:   Comments: decreased step length   POSTURE:  Thoracic kyphosis mild   LUMBARAROM/PROM   A/PROM A/PROM  08/26/2021  Flexion 60%  Extension WFL  Right lateral flexion WFL  Left lateral flexion WFL  Right rotation WFL  Left rotation WFL   (Blank rows = not tested)                          Bil hip PROM - 75% limited throughout and painful    LE MMT:   MMT Right 08/26/2021 Left 08/26/2021  Hip flexion      Hip extension      Hip abduction 4/5 4/5  Hip adduction 5/5 5/5  Hip internal rotation  Hip external rotation      Knee flexion      Knee extension      Ankle dorsiflexion      Ankle plantarflexion      Ankle inversion      Ankle eversion                PALPATION:   General  lumbar and thoracic tight                                              Internal Pelvic Floor - deferred       TODAY'S TREATMENT  Treatment:12/03/21 Self care: Vaginal moisturizers and self stretch techniques educated and handout given  Manual Patient will not tolerate anything internal today  Lumbar and thoracic MFR and cupping and STM  Trigger Point Dry-Needling  Treatment instructions: Expect mild to moderate muscle soreness. S/S of pneumothorax if dry needled over a lung field, and to seek immediate medical attention should they occur. Patient verbalized understanding of these instructions and education.  Patient Consent Given: Yes Education handout provided: Yes Muscles treated: lumbar and thoracic erector spinea Electrical stimulation performed: No Parameters: N/A Treatment response/outcome: increased soft tissue length palpated  and twitch response  Treatment:11/19/21 Exercises  Sitting on pball and doing circles to release pelvic floor Sitting on foam noodle for pressure release  Manual Patient will not tolerate anything internal today Internal to puborectalis left more than right due to right side very tender and inflamed Lumbar and thoracic MFR; gluteal and lumbar STM  Trigger Point Dry-Needling  Treatment instructions: Expect mild to moderate muscle soreness. S/S of pneumothorax if dry needled over a lung field, and to seek immediate medical attention should they occur. Patient verbalized understanding of these instructions and education.  Patient Consent Given: Yes Education handout provided: Yes Muscles treated: right gluteals Electrical stimulation performed: No Parameters: N/A Treatment response/outcome: increased soft tissue length palpated and twitch response  Treatment:11/12/21 Exercises  Piriformis 2 ways in sitting  Self care: Educated on using pelvic wand to release puborectalis  Manual Patient confirms identification and approves physical therapist to perform internal soft tissue work  Internal to puborectalis left more than right due to right side very tender and inflamed Lumbar and thoracic MFR; gluteal and lumbar STM  Trigger Point Dry-Needling  Treatment instructions: Expect mild to moderate muscle soreness. S/S of pneumothorax if dry needled over a lung field, and to seek immediate medical attention should they occur. Patient verbalized understanding of these instructions and education.  Patient Consent Given: Yes Education handout provided: Yes Muscles treated: right gluteals Electrical stimulation performed: No Parameters: N/A Treatment response/outcome: increased soft tissue length palpated and twitch response       PATIENT EDUCATION:  Education details: Access Code: [email or web address]; self stretch Person educated: Patient Education method: Customer service manager Education  comprehension: verbalized understanding and returned demonstration     HOME EXERCISE PROGRAM: Access Code: [email or web address] URL: [email or web address] Date: 09/17/2021 Prepared by: Jari Favre  Exercises - Supine Diaphragmatic Breathing  - 3 x daily - 7 x weekly - 1 sets - 10 reps - Supine Lower Trunk Rotation  - 1 x daily - 7 x weekly - 1 sets - 10 reps - 5 sec hold - Thoracic Extension with Noodle/Towel  - 1 x daily - 7 x weekly - 3 sets - 10 reps - Cat Cow  to Child's Pose  - 1 x daily - 7 x weekly - 1 sets - 5 reps - 10 sec hold - Quadruped Thoracic Spine Extension  - 1 x daily - 7 x weekly - 3 sets - 10 reps - Supine Pelvic Floor Stretch  - 1 x daily - 7 x weekly - 1 sets - 3 reps - 30 sec hold - Hooklying Single Knee to Chest  - 1 x daily - 7 x weekly - 3 sets - 10 reps ASSESSMENT:   CLINICAL IMPRESSION: Pt is remainging the same.  Goals updated below.  She is doing well with all HEP at this time.  Pt is recommended to d/c.  OBJECTIVE IMPAIRMENTS decreased activity tolerance, decreased coordination, decreased endurance, decreased ROM, decreased strength, increased fascial restrictions, increased muscle spasms, impaired flexibility, impaired tone, postural dysfunction, and pain.    ACTIVITY LIMITATIONS community activity, driving, occupation, and personal relationship, toileting .    PERSONAL FACTORS 1-2 comorbidities: vaginal deliveries, hysterectomy, rectal cancer with radiation and 3+ comorbidities:    are also affecting patient's functional outcome.      REHAB POTENTIAL: Excellent   CLINICAL DECISION MAKING: Evolving/moderate complexity   EVALUATION COMPLEXITY: Moderate     GOALS: Goals reviewed with patient? Yes   SHORT TERM GOALS: Target date: 09/23/2021   Ind with toileting techniques Baseline: Goal status: met       LONG TERM GOALS: Target date: 01/14/22  updated 11/19/21  Pt will be independent with advanced HEP to maintain improvements made  throughout therapy   Baseline:  Goal status: Met   2.  Pt will report 50% reduction of pain during BM to improvements in posture, strength, and muscle length   Baseline: has fissure and working on things to heal that Goal status: Partially met   3.  Pt will be able to have a BM without straining Baseline:   Goal status: Met   4.  Pt will be able to fully empty bladder without pain  Baseline: 10/29/21  Goal status: Met  5. Pt will understand how to use the wand to release muscles in the pelvic floor for reduced pain with intercourse and bowel movements   Baseline: painful up to 8/10  Goal status: Met     PLAN: PT FREQUENCY: 1x/week   PT DURATION: 8 weeks   PLANNED INTERVENTIONS: Therapeutic exercises, Therapeutic activity, Neuromuscular re-education, Balance training, Gait training, Patient/Family education, Joint mobilization, Dry Needling, Electrical stimulation, Cryotherapy, Moist heat, Biofeedback, and Manual therapy   PLAN FOR NEXT SESSION: d/c today     Jule Ser, PT 12/03/2021, 1:06 PM   PHYSICAL THERAPY DISCHARGE SUMMARY  Visits from Start of Care: 6  Current functional level related to goals / functional outcomes:   See above Remaining deficits: See above   Education / Equipment: HEP   Patient agrees to discharge. Patient goals were met. Patient is being discharged due to meeting the stated rehab goals.  Janey Genta Akito Boomhower, PT 12/03/21 1:12 PM

## 2021-12-10 ENCOUNTER — Ambulatory Visit: Payer: BLUE CROSS/BLUE SHIELD | Admitting: Physical Therapy

## 2022-02-01 ENCOUNTER — Encounter: Payer: Self-pay | Admitting: Hematology

## 2022-02-09 ENCOUNTER — Other Ambulatory Visit: Payer: Self-pay | Admitting: Nurse Practitioner

## 2022-02-09 MED ORDER — HYDROCODONE-ACETAMINOPHEN 5-325 MG PO TABS: ORAL_TABLET | ORAL | 0 refills | Status: DC

## 2022-02-15 ENCOUNTER — Inpatient Hospital Stay: Payer: BC Managed Care – PPO | Attending: Nurse Practitioner

## 2022-02-15 ENCOUNTER — Ambulatory Visit (HOSPITAL_COMMUNITY)
Admission: RE | Admit: 2022-02-15 | Discharge: 2022-02-15 | Disposition: A | Discharge status: Home / Self Care | Payer: BC Managed Care – PPO | Source: Ambulatory Visit | Attending: Hematology | Admitting: Hematology

## 2022-02-15 DIAGNOSIS — Z79899 Other long term (current) drug therapy: Secondary | ICD-10-CM | POA: Insufficient documentation

## 2022-02-15 DIAGNOSIS — K6289 Other specified diseases of anus and rectum: Secondary | ICD-10-CM | POA: Insufficient documentation

## 2022-02-15 DIAGNOSIS — C20 Malignant neoplasm of rectum: Secondary | ICD-10-CM | POA: Insufficient documentation

## 2022-02-15 DIAGNOSIS — Z8669 Personal history of other diseases of the nervous system and sense organs: Secondary | ICD-10-CM | POA: Insufficient documentation

## 2022-02-15 DIAGNOSIS — N898 Other specified noninflammatory disorders of vagina: Secondary | ICD-10-CM | POA: Insufficient documentation

## 2022-02-15 DIAGNOSIS — R519 Headache, unspecified: Secondary | ICD-10-CM | POA: Insufficient documentation

## 2022-02-15 LAB — CMP (CANCER CENTER ONLY)
ALT: 12 U/L (ref 0–44)
AST: 14 U/L — ABNORMAL LOW (ref 15–41)
Albumin: 4 g/dL (ref 3.5–5.0)
Alkaline Phosphatase: 79 U/L (ref 38–126)
Anion gap: 8 (ref 5–15)
BUN: 13 mg/dL (ref 6–20)
CO2: 31 mmol/L (ref 22–32)
Calcium: 9.2 mg/dL (ref 8.9–10.3)
Chloride: 104 mmol/L (ref 98–111)
Creatinine: 0.99 mg/dL (ref 0.44–1.00)
GFR, Estimated: >60 mL/min (ref >60)
Glucose, Bld: 118 mg/dL — ABNORMAL HIGH (ref 70–99)
Potassium: 2.9 mmol/L — ABNORMAL LOW (ref 3.5–5.1)
Sodium: 143 mmol/L (ref 135–145)
Total Bilirubin: 0.3 mg/dL (ref 0.3–1.2)
Total Protein: 6.9 g/dL (ref 6.5–8.1)

## 2022-02-15 LAB — CBC WITH DIFFERENTIAL (CANCER CENTER ONLY)
Abs Immature Granulocytes: 0 10*3/uL (ref 0.00–0.07)
Basophils Absolute: 0 10*3/uL (ref 0.0–0.1)
Basophils Relative: 1 %
Eosinophils Absolute: 0.1 10*3/uL (ref 0.0–0.5)
Eosinophils Relative: 3 %
HCT: 40.9 % (ref 36.0–46.0)
Hemoglobin: 14.2 g/dL (ref 12.0–15.0)
Immature Granulocytes: 0 %
Lymphocytes Relative: 24 %
Lymphs Abs: 0.8 10*3/uL (ref 0.7–4.0)
MCH: 31.5 pg (ref 26.0–34.0)
MCHC: 34.7 g/dL (ref 30.0–36.0)
MCV: 90.7 fL (ref 80.0–100.0)
Monocytes Absolute: 0.5 10*3/uL (ref 0.1–1.0)
Monocytes Relative: 14 %
Neutro Abs: 1.9 10*3/uL (ref 1.7–7.7)
Neutrophils Relative %: 58 %
Platelet Count: 220 10*3/uL (ref 150–400)
RBC: 4.51 MIL/uL (ref 3.87–5.11)
RDW: 13.3 % (ref 11.5–15.5)
WBC Count: 3.2 10*3/uL — ABNORMAL LOW (ref 4.0–10.5)
nRBC: 0 % (ref 0.0–0.2)

## 2022-02-15 LAB — CEA (IN HOUSE-CHCC): CEA (CHCC-In House): 2.31 ng/mL (ref 0.00–5.00)

## 2022-02-18 ENCOUNTER — Other Ambulatory Visit: Payer: BLUE CROSS/BLUE SHIELD

## 2022-02-18 ENCOUNTER — Encounter: Payer: Self-pay | Admitting: Hematology

## 2022-02-18 ENCOUNTER — Other Ambulatory Visit: Payer: Self-pay

## 2022-02-18 ENCOUNTER — Inpatient Hospital Stay (HOSPITAL_BASED_OUTPATIENT_CLINIC_OR_DEPARTMENT_OTHER): Payer: BC Managed Care – PPO | Admitting: Hematology

## 2022-02-18 VITALS — BP 128/96 | HR 92 | Temp 97.9°F | Resp 16 | Ht 64.0 in | Wt 164.9 lb

## 2022-02-18 DIAGNOSIS — G8929 Other chronic pain: Secondary | ICD-10-CM

## 2022-02-18 DIAGNOSIS — C20 Malignant neoplasm of rectum: Secondary | ICD-10-CM | POA: Diagnosis not present

## 2022-02-18 DIAGNOSIS — N898 Other specified noninflammatory disorders of vagina: Secondary | ICD-10-CM | POA: Diagnosis not present

## 2022-02-18 DIAGNOSIS — Z8669 Personal history of other diseases of the nervous system and sense organs: Secondary | ICD-10-CM | POA: Diagnosis not present

## 2022-02-18 DIAGNOSIS — R519 Headache, unspecified: Secondary | ICD-10-CM

## 2022-02-18 DIAGNOSIS — K6289 Other specified diseases of anus and rectum: Secondary | ICD-10-CM | POA: Diagnosis not present

## 2022-02-18 DIAGNOSIS — Z79899 Other long term (current) drug therapy: Secondary | ICD-10-CM | POA: Diagnosis not present

## 2022-02-18 NOTE — Progress Notes (Signed)
Per Dr. Burr Medico, referral has been faxed to [facility] for the patients headaches. Faxed to [phone] and confirmation has been received.

## 2022-02-18 NOTE — Progress Notes (Signed)
[place]   [phone] [phone]   Clinic Follow up Note   Patient Care Team: Patient, No Pcp Per as PCP - General (General Practice) Leighton Ruff, MD as Consulting Physician (General Surgery) Truitt Merle, MD as Consulting Physician (Hematology) Alla Feeling, NP as Nurse Practitioner (Nurse Practitioner)  Date of Service:  02/18/2022  CHIEF COMPLAINT: f/u of rectal cancer  CURRENT THERAPY:  Surveillance  ASSESSMENT & PLAN:  Kayla Hobbs is a 51 y.o. female with   1. Moderately differentiated rectal adenocarcinoma, G2, cT2N0M0 stage I -presented with rectal bleeding in 06/2021, present since 07/2019 but patient cancelled colonoscopy at that time. Colonoscopy on 06/18/21 by Dr. Havery [name] showed a palpable ~3 cm polyp, path revealed moderately differentiated adenocarcinoma invading submucosa, with close but negative margin. MMR normal. -Staging CT CAP on 07/02/21 was negative. -Staging pelvic MRI on 07/03/21 showed no identifiable lesion. -s/p concurrent chemoRT 07/15/21 - 09/02/21 with Xeloda. She tolerated treatment well with no significant side effects aside from irritation. -sigmoidoscopy on 11/25/21 showed small anal fissure, otherwise benign. -she saw Dr. Marcello Moores on 12/21/21, anoscope not performed at that time due to fissure. -restaging pelvis MRI on 02/15/22 showed NED. -she is doing well from a cancer standpoint but continues to deal with side effects (see #2). Labs from 10/9 reviewed, overall WNL except for low potassium. She endorses adding potassium supplement recently for this. -she is due for full colonoscopy in 06/2022. I ordered repeat MRI to be done in late 08/2022.   2. Symptom management: Rectal discomfort, Vaginal tightness -secondary to radiation, anal fissure -she reports persistent rectal pain with BM, which she notes are soft but formed. She was given small amount of norco to get her through MRI. -she completed PT; I encouraged  her to continue at-home pelvic exercises. -she has not yet f/u with her GYN, scheduled in 2 weeks to discuss dilators for the vaginal tightness.  3. Anal Fissure -seen on sigmoidoscopy 11/2021 -she still has pain with BM, he uses steroid cream   4. Headaches, H/o migraines -pt with h/o migraines that had previously resolved -she reports daily headaches to b/l temples for the last few months, not relieved with OTC pain medication. -she also has anxiety and questions correlation, on wellbutrin -I recommend referral to neurology for further work up; she agrees.     PLAN: -referral to neurology for daily headaches -f/u with GYN 03/02/22 -plan for colonoscopy 06/2022, she will call GI if needed  -f/u in 6 months with lab and MRI several days before   No problem-specific Assessment & Plan notes found for this encounter.   SUMMARY OF ONCOLOGIC HISTORY: Oncology History  Rectal cancer ([place])  06/18/2021 Cancer Staging   Staging form: Colon and Rectum, AJCC 8th Edition - Clinical stage from 06/18/2021: Stage I (cT1, cN0, cM0) - Signed by Truitt Merle, MD on 07/11/2021 Stage prefix: Initial diagnosis Total positive nodes: 0 Histologic grade (G): G2 Histologic grading system: 4 grade system   06/18/2021 Procedure   Colonoscopy per Dr. Havery [name], impression - Polypoid lesion found on digital rectal exam. - One diminutive polyp in the ascending colon, removed with a cold biopsy forceps. Resected and retrieved. - One 5 mm polyp in the sigmoid colon, removed with a cold snare. Resected and retrieved. - One large polyp in the distal rectum. Resected and retrieved as outlined. Injected/tattooed. Clips were placed. - Polypoid lesions in the distal rectum. Biopsied. - Internal hemorrhoids. - The examination was otherwise normal.  06/18/2021 Initial Biopsy   Diagnosis 1. Surgical [P], colon, distal rectal biopsy - CONDYLOMA ACUMINATA 2. Surgical [P], colon, ascending, sigmoid, polyp (2) - TUBULAR  ADENOMA (2 OF 2 FRAGMENTS) - NO HIGH-GRADE DYSPLASIA OR MALIGNANCY IDENTIFIED 3. Surgical [P], colon, rectum, polyp (1) - ADENOCARCINOMA ARISING IN A TUBULAR ADENOMA WITH HIGH-GRADE DYSPLASIA - SEE COMMENT Microscopic Comment 3. The invasive adenocarcinoma appears moderately differentiated and invades the submucosa for a depth of 0.8 cm. The adenocarcinoma comes within 0.1 cm from the base of the polyp. High-grade features including lymphovascular space invasion, tumor budding or poor differentiation are not identified. Dr. Vic Ripper reviewed the case and agrees with the above diagnosis.   07/02/2021 Imaging   CT CAP IMPRESSION: Negative. No evidence of metastatic disease or other significant abnormality.   07/03/2021 Imaging   Pelvic MRI w/wo contrast IMPRESSION: Rectal adenocarcinoma T stage: T2.  Lesion not identified  Rectal adenocarcinoma N stage:  N0  Distance from tumor to the internal anal sphincter. Lesion not identified   07/08/2021 Initial Diagnosis   Rectal cancer [facility])      INTERVAL HISTORY:  Kayla Hobbs is here for a follow up of rectal cancer. She was last seen by me on 11/18/21. She presents to the clinic alone. She reports she continues to have rectal irritation with BM, which are soft. She reports she gets headaches, located to bilateral temples, at least once a day. She notes a history of migraines that had initially resolved. She does have a history of anxiety and wonders if this is contributing.   All other systems were reviewed with the patient and are negative.  MEDICAL HISTORY:  Past Medical History:  Diagnosis Date   Arthritis    Hypertension    Rectal cancer ([place])    Skin cancer    basil top of heasd     SURGICAL HISTORY: Past Surgical History:  Procedure Laterality Date   PARTIAL HYSTERECTOMY  05/2020   due to heavy bleeding   SKIN CANCER EXCISION      I have reviewed the social history and family history with the patient and they are  unchanged from previous note.  ALLERGIES:  has No Known Allergies.  MEDICATIONS:  Current Outpatient Medications  Medication Sig Dispense Refill   AMBULATORY NON FORMULARY MEDICATION Place 30 g rectally 3 (three) times daily as needed. Medication Name: Diltiazem 2% and Lidocaine 5% ointment. Apply pea sized amount 3 times daily. 30 g 0   buPROPion (WELLBUTRIN SR) 150 MG 12 hr tablet Take 150 mg by mouth 2 (two) times daily.     busPIRone (BUSPAR) 15 MG tablet Take 15 mg by mouth daily in the afternoon.     estradiol (ESTRACE) 0.5 MG tablet Take 0.5 mg by mouth daily.     fluticasone (FLONASE) 50 MCG/ACT nasal spray Place 2 sprays into both nostrils daily. 16 g 0   HYDROcodone-acetaminophen (NORCO) 5-325 MG tablet Take 1 tablet as needed every 6 hours for painful BM 3 tablet 0   KLOR-CON M20 20 MEQ tablet TAKE 1 TABLET BY MOUTH TWICE A DAY (Patient not taking: Reported on 10/13/2021) 180 tablet 1   SUMAtriptan (IMITREX) 50 MG tablet Take 50 mg by mouth daily as needed. (Patient not taking: Reported on 07/08/2021)     No current facility-administered medications for this visit.    PHYSICAL EXAMINATION: ECOG PERFORMANCE STATUS: 0 - Asymptomatic  Vitals:   02/18/22 1210  BP: (!) 128/96  Pulse: 92  Resp: 16  Temp: 97.9  F (36.6 C)  SpO2: 98%   Wt Readings from Last 3 Encounters:  02/18/22 164 lb 14.4 oz (74.8 kg)  11/25/21 157 lb (71.2 kg)  11/18/21 162 lb 6.4 oz (73.7 kg)     GENERAL:alert, no distress and comfortable SKIN: skin color, texture, turgor are normal, no rashes or significant lesions EYES: normal, Conjunctiva are pink and non-injected, sclera clear  NECK: supple, thyroid normal size, non-tender, without nodularity LYMPH:  no palpable lymphadenopathy in the cervical, axillary  LUNGS: clear to auscultation and percussion with normal breathing effort HEART: regular rate & rhythm and no murmurs and no lower extremity edema ABDOMEN:abdomen soft, non-tender and normal bowel  sounds Musculoskeletal:no cyanosis of digits and no clubbing  NEURO: alert & oriented x 3 with fluent speech, no focal motor/sensory deficits  LABORATORY DATA:  I have reviewed the data as listed    Latest Ref Rng & Units 02/15/2022   11:42 AM 11/18/2021   12:55 PM 09/17/2021   11:47 AM  CBC  WBC 4.0 - 10.5 K/uL 3.2  4.3  4.1   Hemoglobin 12.0 - 15.0 g/dL 14.2  14.5  13.4   Hematocrit 36.0 - 46.0 % 40.9  43.0  37.7   Platelets 150 - 400 K/uL 220  220  177         Latest Ref Rng & Units 02/15/2022   11:42 AM 11/18/2021   12:55 PM 09/17/2021   11:47 AM  CMP  Glucose 70 - 99 mg/dL 118  76  104   BUN 6 - 20 mg/dL 13  15  13    Creatinine 0.44 - 1.00 mg/dL 0.99  0.98  0.98   Sodium 135 - 145 mmol/L 143  140  140   Potassium 3.5 - 5.1 mmol/L 2.9  4.0  2.8   Chloride 98 - 111 mmol/L 104  105  100   CO2 22 - 32 mmol/L 31  29  34   Calcium 8.9 - 10.3 mg/dL 9.2  9.5  9.2   Total Protein 6.5 - 8.1 g/dL 6.9  7.0  6.8   Total Bilirubin 0.3 - 1.2 mg/dL 0.3  0.3  0.5   Alkaline Phos 38 - 126 U/L 79  69  79   AST 15 - 41 U/L 14  14  12    ALT 0 - 44 U/L 12  13  9        RADIOGRAPHIC STUDIES: I have personally reviewed the radiological images as listed and agreed with the findings in the report. No results found.    Orders Placed This Encounter  Procedures   MR PELVIS WO CM RECTAL CA STAGING    Standing Status:   Future    Standing Expiration Date:   02/19/2023    Order Specific Question:   If indicated for the ordered procedure, I authorize the administration of contrast media per Radiology protocol    Answer:   Yes    Order Specific Question:   What is the patient's sedation requirement?    Answer:   No Sedation    Order Specific Question:   Does the patient have a pacemaker or implanted devices?    Answer:   No    Order Specific Question:   Preferred imaging location?    Answer:   [facility] (table limit - 550 lbs)   Ambulatory referral to Neurology    Referral  Priority:   Elective    Referral Type:   Consultation    Referral  Reason:   Specialty Services Required    Requested Specialty:   Neurology    Number of Visits Requested:   1   All questions were answered. The patient knows to call the clinic with any problems, questions or concerns. No barriers to learning was detected. The total time spent in the appointment was 30 minutes.     Truitt Merle, MD 02/18/2022   I, Wilburn Mylar, am acting as scribe for Truitt Merle, MD.   I have reviewed the above documentation for accuracy and completeness, and I agree with the above.

## 2022-02-19 ENCOUNTER — Telehealth: Payer: Self-pay | Admitting: Hematology

## 2022-02-19 NOTE — Telephone Encounter (Signed)
Spoke with patient confirming 4/12 appointments

## 2022-02-19 NOTE — Progress Notes (Unsigned)
Initial neurology clinic note  Kayla Hobbs MRN: [id number] DOB: [birth year]  Referring provider: Truitt Merle, MD  Primary care provider: Patient, No Pcp Per  Reason for consult:  headaches  Subjective:  This is [name], a 51 y.o. 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 The patient is alone today.  Patient initially had headaches about 10 years ago. At that time, patient would have neck pain and occipital region headaches. She felt tense and tight in the neck and felt that may have been the cause. These would last a few days to a whole day. She does not remember the headache frequency. She saw someone who gave her something, but she does not remember the details. She was even given a shot in the office. She had none of these type of headaches for at least 6 years.  She started having headaches again about 3 months ago. She gets headaches multiple times per week (at least 4 times per week). The headache can last hours to a full day. They are located behind the eyes and across the forehead. Sometimes it is a dull ache. She does not endorse significant sinus pressure or sinus symptoms. She endorses photophobia, but denies phonophobia, nausea or vomiting. She denies aura or focal weakness, numbness associated with it. Her headaches gradually increase in intensity to max after about 30 minutes. She never gets woken from sleep. It does not change with position change. She prefers not to move during a headache though. She has not identified a clear trigger. She tries to take alleve or tylenol which does not help it much. She thinks she takes an over the counter NSAID about 4 times per week. She was prescribed sumatriptan 50 mg PRN. She took it once and didn't like the way it felt. She lost it and has not been  able to retry it. She does not recall ever being on a medication to prevent headaches.  She does not currently take any vitamins.  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  Other notable medications: wellbutrin, buspar, estrogen (been on for about 1.5 years after hysterectomy)  Caffeine:  1 soda per day Alcohol:  occasional glass of wine (every other day) Smoker:  former, quit about 7 years ago Diet:  Regular diet Exercise:  not a lot, fatigue due to cancer treatments Depression:  not significant; Anxiety: quite a bit of worrying due to cancer Other pain:  Neck pain from arthritis Sleep hygiene:  pretty good most days Family history of headache:  none   MEDICATIONS:  Outpatient Encounter Medications as of 02/23/2022  Medication Sig   AMBULATORY NON FORMULARY MEDICATION Place 30 g rectally 3 (three) times daily as needed. Medication Name: Diltiazem 2% and Lidocaine 5% ointment. Apply pea sized amount 3 times daily.   buPROPion (WELLBUTRIN SR) 150 MG 12 hr tablet Take 150 mg by mouth 2 (two) times daily.   busPIRone (BUSPAR) 15 MG tablet Take 15 mg by mouth daily in the afternoon.   estradiol (ESTRACE) 0.5 MG tablet Take 0.5 mg by mouth daily.   fluticasone (FLONASE) 50 MCG/ACT nasal spray Place 2 sprays into both nostrils daily.   KLOR-CON M20 20 MEQ tablet TAKE 1 TABLET BY MOUTH TWICE A DAY   [DISCONTINUED] HYDROcodone-acetaminophen (NORCO) 5-325 MG tablet Take 1  tablet as needed every 6 hours for painful BM   [DISCONTINUED] SUMAtriptan (IMITREX) 50 MG tablet Take 50 mg by mouth daily as needed. (Patient not taking: Reported on 07/08/2021)   No facility-administered encounter medications on file as of 02/23/2022.    PAST MEDICAL HISTORY: Past Medical History:  Diagnosis Date   Arthritis    Hypertension    Rectal cancer ([place])    Skin cancer    basil top of heasd     PAST SURGICAL HISTORY: Past Surgical History:   Procedure Laterality Date   PARTIAL HYSTERECTOMY  05/2020   due to heavy bleeding   SKIN CANCER EXCISION      ALLERGIES: No Known Allergies  FAMILY HISTORY: Family History  Problem Relation Age of Onset   Heart disease Father    Stroke Father    Cancer Brother        liver cancer possibly [place]   Cancer Maternal Grandmother        pancreas   Pancreatic cancer Maternal Grandmother    Diabetes Paternal Grandmother    Heart disease Paternal Grandfather    Cancer Cousin        leukemia   Colon cancer Neg Hx    Stomach cancer Neg Hx    Esophageal cancer Neg Hx     SOCIAL HISTORY: Social History   Tobacco Use   Smoking status: Former    Types: Cigarettes   Smokeless tobacco: Never   Tobacco comments:    Smoked intermittently up to 5 cig per day for 10-15 years, quit 5 years ago  Vaping Use   Vaping Use: Never used  Substance Use Topics   Alcohol use: Yes    Comment: Occasional Drinks wine   Drug use: Never   Social History   Social History Narrative   Right Handed    Lives in a one story home     Objective:  Vital Signs:  BP 133/83   Pulse 80   Ht '5\' 4"'$  (1.626 m)   Wt 166 lb (75.3 kg)   SpO2 99%   BMI 28.49 kg/m   General: No acute distress.  Patient appears well-groomed.   Head:  Normocephalic/atraumatic Eyes:  fundi examined but not visualized Neck: supple, mild paraspinal tenderness, full range of motion Heart: regular rate and rhythm Lungs: Clear to auscultation bilaterally. Vascular: No carotid bruits.  Neurological Exam: Mental status: alert and oriented to person, place, and time, speech fluent and not dysarthric, language intact.  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 CN VII: upper and lower face symmetric CN VIII: hearing intact CN IX, X: gag intact,  uvula midline CN XI: sternocleidomastoid and trapezius muscles intact CN XII: tongue midline  Bulk & Tone: normal Motor:  muscle strength 5/5 throughout Deep Tendon Reflexes:  2+ throughout Sensation:  Sensation intact to light touch in all extremities Finger to nose testing:  Without dysmetria.   Gait:  Normal station and stride.  Romberg negative.  Labs and Imaging review: Internal labs: No results found for: "HGBA1C" No results found for: "VITAMINB12" No results found for: "TSH" No results found for: "ESRSEDRATE", "POCTSEDRATE" Normal or unremarkable: CBC CMP significant for hypokalemia and mildly elevated glucose  Assessment/Plan:  Yanelie Abraha is a 51 y.o. female who presents for evaluation of headaches. She  has a relevant medical history of anxiety, moderately differentiated rectal adenocarcinoma, HTN, OA, former smoker. 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 Patient has a history of neck/occipital headaches 10 years prior but has not had a similar headache for 6 years. Her current headaches sound different. She also describes difficulty with memory. Given that she has a new headache type after the age of 74 and possible cognitive changes, I will evaluate with lab work and CT head. This could represent migraines vs sinusitis. Given her headache frequency, I will start a headache preventative medication in addition to an abortive medication. I will start with propranolol given that patient is already on serotonergic medications (TCA would not be a good option). I will try another triptan (Maxalt) given that a previous dose of Imitrex was not well tolerated.  PLAN: -Blood work: B12, TSH, Mg, Vit D -CT head wo contrast -OTC vitamins to take for headaches given (magnesium, riboflavin, feverfew, Co-Q10, melatonin, ginger) -Limit OTC NSAIDs as able, ideally less than 10 doses per month -Start propranolol 20 mg BID for  headache prevention -Start maxalt 10 mg PRN for abortive therapy, can repeat after 2 hours if needed  -Return to clinic 2-3 months  The impression above as well as the plan as outlined below were extensively discussed with the patient who voiced understanding. All questions were answered to their satisfaction.  When available, results of the above investigations and possible further recommendations will be communicated to the patient via telephone/MyChart. Patient to call office if not contacted after expected testing turnaround time.   Total time spent reviewing records, interview, history/exam, documentation, and coordination of care on day of encounter:  65 min   Thank you for allowing me to participate in patient's care.  If I can answer any additional questions, I would be pleased to do so.  Kai Levins, MD   CC: Patient, No Pcp Per No address on file  CC: Referring provider: Truitt Merle, MD [address] [place],  [place] [address]

## 2022-02-23 ENCOUNTER — Encounter: Payer: Self-pay | Admitting: Neurology

## 2022-02-23 ENCOUNTER — Ambulatory Visit (INDEPENDENT_AMBULATORY_CARE_PROVIDER_SITE_OTHER): Payer: BC Managed Care – PPO | Admitting: Neurology

## 2022-02-23 ENCOUNTER — Other Ambulatory Visit (INDEPENDENT_AMBULATORY_CARE_PROVIDER_SITE_OTHER): Payer: BC Managed Care – PPO

## 2022-02-23 VITALS — BP 133/83 | HR 80 | Ht 64.0 in | Wt 166.0 lb

## 2022-02-23 DIAGNOSIS — G43009 Migraine without aura, not intractable, without status migrainosus: Secondary | ICD-10-CM

## 2022-02-23 DIAGNOSIS — R519 Headache, unspecified: Secondary | ICD-10-CM

## 2022-02-23 LAB — MAGNESIUM: Magnesium: 1.7 mg/dL (ref 1.5–2.5)

## 2022-02-23 LAB — VITAMIN D 25 HYDROXY (VIT D DEFICIENCY, FRACTURES): VITD: 45.05 ng/mL (ref 30.00–100.00)

## 2022-02-23 LAB — VITAMIN B12: Vitamin B-12: 428 pg/mL (ref 211–911)

## 2022-02-23 LAB — TSH: TSH: 2.03 u[IU]/mL (ref 0.35–5.50)

## 2022-02-23 MED ORDER — RIZATRIPTAN BENZOATE 10 MG PO TABS: 10.0000 mg | ORAL_TABLET | ORAL | 0 refills | Status: DC | PRN

## 2022-02-23 MED ORDER — PROPRANOLOL HCL 20 MG PO TABS: 20.0000 mg | ORAL_TABLET | Freq: Two times a day (BID) | ORAL | 6 refills | Status: DC

## 2022-02-23 NOTE — Patient Instructions (Signed)
I saw you today for headaches.  I would like to evaluate further with: -Lab work today -CT of your head   I will be in touch when I have these results.  For headache prevention, I am starting propranolol 20 mg twice daily.   To stop headaches when they occur, I am starting Maxalt 10 mg as needed. You can repeat after 2 hours if needed.   Limit tylenol and alleve type of medications as able. Ideally you would take less than 10 doses of these in a month.  If you headache is not going away, please contact our office to let me know (or send me a MyChart message).  Vitamins and herbs that show potential  Magnesium: Magnesium (250 mg twice a day or 500 mg at bed) has a relaxant effect on smooth muscles such as blood vessels. Individuals suffering from frequent or daily headache usually have low magnesium levels which can be increase with daily supplementation of 400-750 mg. Three trials found 40-90% average headache reduction when used as a preventative. Magnesium also demonstrated the benefit in menstrually related migraine. Magnesium is part of the messenger system in the serotonin cascade and it is a good muscle relaxant. It is also useful for constipation which can be a side effect of other medications used to treat migraine. Good sources include nuts, whole grains, and tomatoes.  Riboflavin (vitamin B 2) 200 mg twice a day. This vitamin assists nerve cells in the production of ATP a principal energy storing molecule. It is necessary for many chemical reactions in the body. There have been at least 3 clinical trials of riboflavin using 400 mg per day all of which suggested that migraine frequency can be decreased. All 3 trials showed significant improvement in over half of migraine sufferers. The supplement is found in bread, cereal, milk, meat, and poultry. Most Americans get more riboflavin than the recommended daily allowance, however riboflavin deficiency is not necessary for the supplements  to help prevent headache.  Feverfew: Feverfew is a common garden herb native to [place] and popular in [place] as a treatment for disorders typically controlled by aspirin. The mechanism of action is unknown but is believed to be related to a chemical called parthenolide which helps the body use serotonin more effectively. Serotonin helps prevent migraine and assists with resolution when it occurs. Parthenolide also inhibits the release of histamine which is linked to pain and inflammation.  Consistency of active ingredients in different products can be a problem. Some formulations don't have the active ingredient (parthenolide) that prevents migraine. A parthenolide content of 0.2% is generally recommended. Typical dosage is one capsule 3 times a day.  Coenzyme Q10: This is present in almost all cells in the body and is critical component for the conversion of energy. Recent studies have shown that a nutritional supplement of CoQ10 can reduce the frequency of migraine attacks by improving the energy production of cells as with riboflavin. Doses of 150 mg twice a day have been shown to be effective.  Melatonin: Increasing evidence shows correlation between melatonin secretion and headache conditions. Melatonin supplementation has decreased headache intensity and duration. It is widely used as a sleep aid. Sleep is natures way of dealing with migraine. A dose of 3 mg is recommended to start for headaches including cluster headache. Higher doses up to 15 mg has been reviewed for use in Cluster headache and have been used. The rationale behind using melatonin for cluster is that many theories regarding the cause  of Cluster headache center around the disruption of the normal circadian rhythm in the brain. This helps restore the normal circadian rhythm.  Ginger: Ginger has a small amount of antihistamine and anti-inflammatory action which may help headache. It is primarily used for nausea and may aid in  the absorption of other medications.  I would like to see you again in 2-3 months.  The physicians and staff at [facility] are committed to providing excellent care. You may receive a survey requesting feedback about your experience at our office. We strive to receive "very good" responses to the survey questions. If you feel that your experience would prevent you from giving the office a "very good " response, please contact our office to try to remedy the situation. We may be reached at [phone]. Thank you for taking the time out of your busy day to complete the survey.  Kai Levins, MD [facility]

## 2022-02-24 ENCOUNTER — Other Ambulatory Visit: Payer: Self-pay

## 2022-02-24 MED ORDER — POTASSIUM CHLORIDE CRYS ER 20 MEQ PO TBCR: 40.0000 meq | EXTENDED_RELEASE_TABLET | Freq: Two times a day (BID) | ORAL | 0 refills | Status: DC

## 2022-03-25 ENCOUNTER — Encounter: Payer: Self-pay | Admitting: Neurology

## 2022-03-30 ENCOUNTER — Ambulatory Visit
Admission: RE | Admit: 2022-03-30 | Discharge: 2022-03-30 | Disposition: A | Discharge status: Home / Self Care | Payer: BC Managed Care – PPO | Source: Ambulatory Visit | Attending: Neurology | Admitting: Neurology

## 2022-03-30 DIAGNOSIS — R519 Headache, unspecified: Secondary | ICD-10-CM

## 2022-03-30 DIAGNOSIS — G43009 Migraine without aura, not intractable, without status migrainosus: Secondary | ICD-10-CM

## 2022-04-30 NOTE — Progress Notes (Signed)
NEUROLOGY FOLLOW UP OFFICE NOTE  Kayla Hobbs [id number]  Subjective:  Kayla Hobbs is a 51 y.o. 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  To briefly review: Patient initially had headaches about 10 years ago. At that time, patient would have neck pain and occipital region headaches. She felt tense and tight in the neck and felt that may have been the cause. These would last a few days to a whole day. She does not remember the headache frequency. She saw someone who gave her something, but she does not remember the details. She was even given a shot in the office. She had none of these type of headaches for at least 6 years.   She started having headaches again about 3 months ago. She gets headaches multiple times per week (at least 4 times per week). The headache can last hours to a full day. They are located behind the eyes and across the forehead. Sometimes it is a dull ache. She does not endorse significant sinus pressure or sinus symptoms. She endorses photophobia, but denies phonophobia, nausea or vomiting. She denies aura or focal weakness, numbness associated with it. Her headaches gradually increase in intensity to max after about 30 minutes. She never gets woken from sleep. It does not change with position change. She prefers not to move during a headache though. She has not identified a clear trigger. She tries to take alleve or tylenol which does not help it much. She thinks she takes an over the counter NSAID about 4 times per week. She was prescribed sumatriptan 50 mg PRN. She took it once and didn't like the way it felt. She lost it and has not been able to retry it. She does not recall ever being on a medication to prevent headaches.   She does not currently take any vitamins.   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 Patient has a history of neck/occipital headaches 10 years prior but has not had a similar headache for 6 years. Her current headaches sound different. She also describes difficulty with memory. Given that she has a new headache type after the age of 21 and possible cognitive changes, I will evaluate with lab work and CT head. This could represent migraines vs sinusitis. Given her headache frequency, I will start a headache preventative medication in addition to an abortive medication. I will start with propranolol given that patient is already on serotonergic medications (TCA would not be a good option). I will try another triptan (Maxalt) given that a previous dose of Imitrex was not well tolerated.   PLAN: -Blood work: B12, TSH, Mg, Vit D -CT head wo  contrast -OTC vitamins to take for headaches given (magnesium, riboflavin, feverfew, Co-Q10, melatonin, ginger) -Limit OTC NSAIDs as able, ideally less than 10 doses per month -Start propranolol 20 mg BID for headache prevention -Start maxalt 10 mg PRN for abortive therapy, can repeat after 2 hours if needed  Since their last visit: CT head was normal. Labs were also normal.  In terms of patient's headaches, she is having less headaches  and when she does have headaches, they are not as severe. She has also tried the OTC vitamins, but does not remember which. She only takes propranolol in the morning as she does not like taking night medications. She denies any medication side effects.  She has about 1 headache per headache. She does not take maxalt with every headache. She has taken maxalt about 3 total times. She thinks this eases the headache some.  She denies any new symptoms.  MEDICATIONS:  Outpatient Encounter Medications as of 05/13/2022  Medication Sig   AMBULATORY NON FORMULARY MEDICATION Place 30 g rectally 3 (three) times daily as needed. Medication Name: Diltiazem 2% and Lidocaine 5% ointment. Apply pea sized amount 3 times daily.   buPROPion (WELLBUTRIN SR) 150 MG 12 hr tablet Take 150 mg by mouth 2 (two) times daily.   busPIRone (BUSPAR) 15 MG tablet Take 15 mg by mouth daily in the afternoon.   estradiol (ESTRACE) 0.5 MG tablet Take 0.5 mg by mouth daily.   fluticasone (FLONASE) 50 MCG/ACT nasal spray Place 2 sprays into both nostrils daily.   potassium chloride SA (KLOR-CON M20) 20 MEQ tablet Take 2 tablets (40 mEq total) by mouth 2 (two) times daily.   propranolol (INDERAL) 20 MG tablet Take 1 tablet (20 mg total) by mouth 2 (two) times daily.   rizatriptan (MAXALT) 10 MG tablet Take 1 tablet (10 mg total) by mouth as needed for migraine. May repeat in 2 hours if needed   No facility-administered encounter medications on file as of 05/13/2022.    PAST MEDICAL HISTORY: Past Medical History:  Diagnosis Date   Arthritis    Hypertension    Rectal cancer ([place])    Skin cancer    basil top of heasd     PAST SURGICAL HISTORY: Past Surgical History:  Procedure Laterality Date   PARTIAL HYSTERECTOMY  05/2020   due to heavy bleeding   SKIN CANCER EXCISION      ALLERGIES: No Known Allergies  FAMILY HISTORY: Family History  Problem Relation Age of Onset   Heart disease Father    Stroke Father    Cancer  Brother        liver cancer possibly Kayla Hobbs   Cancer Maternal Grandmother        pancreas   Pancreatic cancer Maternal Grandmother    Diabetes Paternal Grandmother    Heart disease Paternal Grandfather    Cancer Cousin        leukemia   Colon cancer Neg Hx    Stomach cancer Neg Hx    Esophageal cancer Neg Hx     SOCIAL HISTORY: Social History   Tobacco Use   Smoking status: Former    Types: Cigarettes   Smokeless tobacco: Never   Tobacco comments:    Smoked intermittently up to 5 cig per day for 10-15 years, quit 5 years ago  Vaping Use   Vaping Use: Never used  Substance Use Topics   Alcohol use: Yes    Comment: Occasional Drinks wine   Drug use: Never  Social History   Social History Narrative   Right Handed    Lives in a one story home       Objective:  Vital Signs:  BP (!) 148/85   Pulse 87   Resp 20   Ht '5\' 4"'$  (1.626 m)   Wt 172 lb (78 kg)   SpO2 99%   BMI 29.52 kg/m   General: No acute distress.  Patient appears well-groomed.   Head:  Normocephalic/atraumatic Neck: supple, positive for paraspinal tenderness, mildly reduced full range of motion Neurological Exam: alert and oriented.  Speech fluent and not dysarthric, language intact.  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 Bulk and tone normal, muscle strength 5/5 throughout.  Sensation to light touch intact.  Finger to nose testing intact.  Gait normal.  Labs and Imaging review: New results: 02/23/22: B12: 428 TSH: 2.08 Mg: 1.7 Vit D: 45.05   CT head wo contrast (03/30/22): FINDINGS: Brain: No evidence of acute infarction, hemorrhage, hydrocephalus, extra-axial collection or mass lesion/mass effect.   Vascular: No hyperdense vessel or unexpected calcification.   Skull: No fracture or focal lesion.   Sinuses/Orbits: Paranasal sinuses and mastoid air cells are clear. The visualized orbits are unremarkable.   Other: None.    IMPRESSION: Normal head CT.  No explanation for headache.   Previously reviewed results: Normal or unremarkable: CBC CMP significant for hypokalemia and mildly elevated glucose  Assessment/Plan:  This is Kayla Hobbs, a 51 y.o. female with headaches and neck pain. Patient has improved in terms of headache frequency and intensity with migraine preventative and abortive medications.  Plan: -PT for neck pain -OTC vitamins to take for headaches given (magnesium, riboflavin, feverfew, Co-Q10, melatonin, ginger) -Limit OTC NSAIDs as able, ideally less than 10 doses per month -propranolol 40 mg in am for headache prevention (change from 20 mg BID) -maxalt 10 mg PRN for abortive therapy, can repeat after 2 hours if needed  Return to clinic in 6 months  Total time spent reviewing records, interview, history/exam, documentation, and coordination of care on day of encounter:  20 min  Kai Levins, MD

## 2022-05-13 ENCOUNTER — Encounter: Payer: Self-pay | Admitting: Neurology

## 2022-05-13 ENCOUNTER — Ambulatory Visit (INDEPENDENT_AMBULATORY_CARE_PROVIDER_SITE_OTHER): Payer: BC Managed Care – PPO | Admitting: Neurology

## 2022-05-13 VITALS — BP 148/85 | HR 87 | Resp 20 | Ht 64.0 in | Wt 172.0 lb

## 2022-05-13 DIAGNOSIS — G43009 Migraine without aura, not intractable, without status migrainosus: Secondary | ICD-10-CM

## 2022-05-13 DIAGNOSIS — R29898 Other symptoms and signs involving the musculoskeletal system: Secondary | ICD-10-CM

## 2022-05-13 DIAGNOSIS — R519 Headache, unspecified: Secondary | ICD-10-CM | POA: Diagnosis not present

## 2022-05-13 DIAGNOSIS — M542 Cervicalgia: Secondary | ICD-10-CM | POA: Diagnosis not present

## 2022-05-13 MED ORDER — PROPRANOLOL HCL 20 MG PO TABS: 40.0000 mg | ORAL_TABLET | Freq: Every day | ORAL | 6 refills | Status: DC

## 2022-05-13 NOTE — Patient Instructions (Addendum)
Migraine prevention:  Propranolol 40 mg (2 tablets) in the morning Over the counter supplements: can try magnesium, riboflavin, feverfew, Co-Q10, melatonin, ginger Migraine rescue:  Maxalt 10 mg as needed Limit use of pain relievers to no more than 2 days out of week to prevent risk of rebound or medication-overuse headache. Keep headache diary Physical therapy for neck pain/tightness as this can help with headaches Follow up in 6 months  Please let me know if you have any questions or concerns in the meantime.   The physicians and staff at [facility] are committed to providing excellent care. You may receive a survey requesting feedback about your experience at our office. We strive to receive "very good" responses to the survey questions. If you feel that your experience would prevent you from giving the office a "very good " response, please contact our office to try to remedy the situation. We may be reached at [phone]. Thank you for taking the time out of your busy day to complete the survey.  Kai Levins, MD [facility]

## 2022-05-31 ENCOUNTER — Encounter: Payer: Self-pay | Admitting: Gastroenterology

## 2022-06-11 ENCOUNTER — Ambulatory Visit (AMBULATORY_SURGERY_CENTER): Payer: BC Managed Care – PPO

## 2022-06-11 VITALS — Ht 64.0 in | Wt 160.0 lb

## 2022-06-11 DIAGNOSIS — Z86010 Personal history of colonic polyps: Secondary | ICD-10-CM

## 2022-06-11 DIAGNOSIS — Z85038 Personal history of other malignant neoplasm of large intestine: Secondary | ICD-10-CM

## 2022-06-11 MED ORDER — NA SULFATE-K SULFATE-MG SULF 17.5-3.13-1.6 GM/177ML PO SOLN: 1.0000 | Freq: Once | ORAL | 0 refills | Status: AC

## 2022-06-11 NOTE — Progress Notes (Signed)
No egg or soy allergy known to patient  No issues known to pt with past sedation with any surgeries or procedures Patient denies ever being told they had issues or difficulty with intubation  No FH of Malignant Hyperthermia Pt is not on diet pills Pt is not on  home 02  Pt is not on blood thinners  Pt denies issues with constipation  No A fib or A flutter Have any cardiac testing pending--no Pt instructed to use [email or web address] or GoodRx for a price reduction on prep   

## 2022-06-21 ENCOUNTER — Encounter: Payer: Self-pay | Admitting: Gastroenterology

## 2022-07-01 ENCOUNTER — Encounter: Payer: Self-pay | Admitting: Gastroenterology

## 2022-07-01 ENCOUNTER — Ambulatory Visit (AMBULATORY_SURGERY_CENTER): Payer: BC Managed Care – PPO | Admitting: Gastroenterology

## 2022-07-01 VITALS — BP 114/76 | HR 62 | Temp 96.8°F | Resp 25 | Ht 64.0 in | Wt 160.0 lb

## 2022-07-01 DIAGNOSIS — Z86010 Personal history of colonic polyps: Secondary | ICD-10-CM

## 2022-07-01 DIAGNOSIS — Z08 Encounter for follow-up examination after completed treatment for malignant neoplasm: Secondary | ICD-10-CM | POA: Diagnosis present

## 2022-07-01 DIAGNOSIS — Z85038 Personal history of other malignant neoplasm of large intestine: Secondary | ICD-10-CM | POA: Diagnosis not present

## 2022-07-01 DIAGNOSIS — K602 Anal fissure, unspecified: Secondary | ICD-10-CM

## 2022-07-01 MED ORDER — AMBULATORY NON FORMULARY MEDICATION: 0 refills | Status: DC

## 2022-07-01 MED ORDER — SODIUM CHLORIDE 0.9 % IV SOLN: 500.0000 mL | Freq: Once | INTRAVENOUS | Status: DC

## 2022-07-01 NOTE — Op Note (Signed)
[facility] Patient Name: Kayla Hobbs Procedure Date: 07/01/2022 11:21 AM MRN: [id number] Endoscopist: Remo Lipps P. 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 [name] tolerance of                            previous anesthesia was also reviewed. The risks                            and benefits of the procedure and the sedation                            options and risks were discussed with the patient.                            All questions were answered, and informed consent                            was obtained. Prior Anticoagulants: The patient has                            taken no anticoagulant or antiplatelet agents. ASA                            Grade Assessment: II - A patient with mild systemic                            disease. After reviewing the risks and benefits,                            the patient was deemed in satisfactory condition to  undergo the procedure.                           After obtaining informed consent, the colonoscope                            was passed under direct vision. Throughout the                            procedure, the patient's blood pressure, pulse, and                            oxygen saturations were monitored continuously. The                             Olympus PCF-H190DL [id number]) Colonoscope was                            introduced through the anus and advanced to the the                            cecum, identified by appendiceal orifice and                            ileocecal valve. The colonoscopy was performed                            without difficulty. The patient tolerated the                            procedure well. The quality of the bowel                            preparation was good. The ileocecal valve,                            appendiceal orifice, and rectum were photographed. Scope In: 11:27:56 AM Scope Out: 11:41:47 AM Scope Withdrawal Time: 0 hours 10 minutes 43 seconds  Total Procedure Duration: 0 hours 13 minutes 51 seconds  Findings:                 An anal fissure was found on perianal exam.                           A post polypectomy scar was found in the distal                            rectum. The scar tissue was healthy in appearance.                           A few small angiodysplastic lesions were found in                            the rectum consistent with very mild radiation  proctitis.                           Internal hemorrhoids were found during                            retroflexion. The hemorrhoids were small. There was                            a suspected small hypertrophied papillae vs.                            condyloma at the dentate line (patient has history                            of condylomas in the past)                           The exam was otherwise without abnormality. Complications:            No immediate complications. Estimated blood loss:                            None. Estimated Blood Loss:     Estimated blood loss: none. Impression:               - Anal fissure found on perianal exam.                           - Post-polypectomy scar in the distal rectum.                           - Very mild radiation proctitis.                            - Internal hemorrhoids.                           - Suspected hypertrophied papillae vs. possible                            diminutive condyloma at the dentate line. Recommendation:           - Patient has a contact number available for                            emergencies. The signs and symptoms of potential                            delayed complications were discussed with the                            patient. Return to normal activities tomorrow.                            Written discharge instructions were provided to the  patient.                           - Resume previous diet.                           - Continue present medications.                           - Start diltiazem / lidocaine ointment - pea sized                            amount PR TID for anal fissure                           - Repeat colonoscopy in 3 years for surveillance.                           - follow up with colorectal surgery for                            surveillance of perianal condyloma Remo Lipps P. Havery [name], MD 07/01/2022 11:48:38 AM This report has been signed electronically.

## 2022-07-01 NOTE — Patient Instructions (Signed)
USe your ointment as directed.  The prescription has been printed.  Resume all of your regular medications today.    YOU HAD AN ENDOSCOPIC PROCEDURE TODAY AT [facility]:   Refer to the procedure report that was given to you for any specific questions about what was found during the examination.  If the procedure report does not answer your questions, please call your gastroenterologist to clarify.  If you requested that your care partner not be given the details of your procedure findings, then the procedure report has been included in a sealed envelope for you to review at your convenience later.  YOU SHOULD EXPECT: Some feelings of bloating in the abdomen. Passage of more gas than usual.  Walking can help get rid of the air that was put into your GI tract during the procedure and reduce the bloating. If you had a lower endoscopy (such as a colonoscopy or flexible sigmoidoscopy) you may notice spotting of blood in your stool or on the toilet paper. If you underwent a bowel prep for your procedure, you may not have a normal bowel movement for a few days.  Please Note:  You might notice some irritation and congestion in your nose or some drainage.  This is from the oxygen used during your procedure.  There is no need for concern and it should clear up in a day or so.  SYMPTOMS TO REPORT IMMEDIATELY:  Following lower endoscopy (colonoscopy or flexible sigmoidoscopy):  Excessive amounts of blood in the stool  Significant tenderness or worsening of abdominal pains  Swelling of the abdomen that is new, acute  Fever of 100F or higher   For urgent or emergent issues, a gastroenterologist can be reached at any hour by calling [phone]. Do not use MyChart messaging for urgent concerns.    DIET:  We do recommend a small meal at first, but then you may proceed to your regular diet.  Drink plenty of fluids but you should avoid alcoholic beverages for 24 hours.  ACTIVITY:  You  should plan to take it easy for the rest of today and you should NOT DRIVE or use heavy machinery until tomorrow (because of the sedation medicines used during the test).    FOLLOW UP: Our staff will call the number listed on your records the next business day following your procedure.  We will call around 7:15- 8:00 am to check on you and address any questions or concerns that you may have regarding the information given to you following your procedure. If we do not reach you, we will leave a message.     If any biopsies were taken you will be contacted by phone or by letter within the next 1-3 weeks.  Please call us at [phone] if you have not heard about the biopsies in 3 weeks.    SIGNATURES/CONFIDENTIALITY: You and/or your care partner have signed paperwork which will be entered into your electronic medical record.  These signatures attest to the fact that that the information above on your After Visit Summary has been reviewed and is understood.  Full responsibility of the confidentiality of this discharge information lies with you and/or your care-partner.

## 2022-07-01 NOTE — Progress Notes (Signed)
Sedate, gd SR, tolerated procedure well, VSS, report to RN 

## 2022-07-01 NOTE — Progress Notes (Signed)
[facility] History and Physical   Primary Care Physician:  Dian Queen, MD   Reason for Procedure:   History of malignant rectal polyp  Plan:    colonoscopy     HPI: Kayla Hobbs is a 52 y.o. female  here for colonoscopy surveillance - malignant rectal polyp removed 06/2021 - treated with chemo and XRT post resection.    Patient denies any bowel symptoms at this time. No family history of colon cancer known. Otherwise feels well without any cardiopulmonary symptoms.   I have discussed risks / benefits of anesthesia and endoscopic procedure with Kayla Hobbs and they wish to proceed with the exams as outlined today.    Past Medical History:  Diagnosis Date   Arthritis    Hypertension    Rectal cancer ([place])    Skin cancer    basil top of heasd     Past Surgical History:  Procedure Laterality Date   PARTIAL HYSTERECTOMY  05/2020   due to heavy bleeding   SKIN CANCER EXCISION      Prior to Admission medications   Medication Sig Start Date End Date Taking? Authorizing Provider  buPROPion (WELLBUTRIN SR) 150 MG 12 hr tablet Take 150 mg by mouth 2 (two) times daily.   Yes [provider]  busPIRone (BUSPAR) 15 MG tablet Take 15 mg by mouth daily in the afternoon. 08/07/21  Yes [provider]  estradiol (ESTRACE) 0.5 MG tablet Take 0.5 mg by mouth daily.   Yes [provider]  hydrochlorothiazide (HYDRODIURIL) 50 MG tablet Take 1 tablet by mouth daily. 03/08/22  Yes [provider]  propranolol (INDERAL) 20 MG tablet Take 2 tablets (40 mg total) by mouth daily. 05/13/22  Yes Hill, Park Pope, MD  AMBULATORY NON FORMULARY MEDICATION Place 30 g rectally 3 (three) times daily as needed. Medication Name: Diltiazem 2% and Lidocaine 5% ointment. Apply pea sized amount 3 times daily. 11/25/21   Barclay Lennox, Carlota Raspberry, MD  fluticasone (FLONASE) 50 MCG/ACT nasal spray Place 2 sprays into both nostrils daily. Patient not taking:  Reported on 06/11/2022 07/21/21   Brunetta Jeans, PA-C  ibuprofen (ADVIL) 800 MG tablet     [provider]  potassium chloride SA (KLOR-CON M20) 20 MEQ tablet Take 2 tablets (40 mEq total) by mouth 2 (two) times daily. 02/24/22   Truitt Merle, MD  rizatriptan (MAXALT) 10 MG tablet Take 1 tablet (10 mg total) by mouth as needed for migraine. May repeat in 2 hours if needed 02/23/22   Shellia Carwin, MD    Current Outpatient Medications  Medication Sig Dispense Refill   buPROPion (WELLBUTRIN SR) 150 MG 12 hr tablet Take 150 mg by mouth 2 (two) times daily.     busPIRone (BUSPAR) 15 MG tablet Take 15 mg by mouth daily in the afternoon.     estradiol (ESTRACE) 0.5 MG tablet Take 0.5 mg by mouth daily.     hydrochlorothiazide (HYDRODIURIL) 50 MG tablet Take 1 tablet by mouth daily.     propranolol (INDERAL) 20 MG tablet Take 2 tablets (40 mg total) by mouth daily. 60 tablet 6   AMBULATORY NON FORMULARY MEDICATION Place 30 g rectally 3 (three) times daily as needed. Medication Name: Diltiazem 2% and Lidocaine 5% ointment. Apply pea sized amount 3 times daily. 30 g 0   fluticasone (FLONASE) 50 MCG/ACT nasal spray Place 2 sprays into both nostrils daily. (Patient not taking: Reported on 06/11/2022) 16 g 0   ibuprofen (ADVIL) 800 MG  tablet      potassium chloride SA (KLOR-CON M20) 20 MEQ tablet Take 2 tablets (40 mEq total) by mouth 2 (two) times daily. 120 tablet 0   rizatriptan (MAXALT) 10 MG tablet Take 1 tablet (10 mg total) by mouth as needed for migraine. May repeat in 2 hours if needed 10 tablet 0   Current Facility-Administered Medications  Medication Dose Route Frequency Provider Last Rate Last Admin   0.9 %  sodium chloride infusion  500 mL Intravenous Once Charnell Peplinski, Carlota Raspberry, MD        Allergies as of 07/01/2022   (No Known Allergies)    Family History  Problem Relation Age of Onset   Heart disease Father    Stroke Father    Cancer Brother        liver cancer possibly [place]    Cancer Maternal Grandmother        pancreas   Pancreatic cancer Maternal Grandmother    Diabetes Paternal Grandmother    Heart disease Paternal Grandfather    Cancer Cousin        leukemia   Colon cancer Neg Hx    Stomach cancer Neg Hx    Esophageal cancer Neg Hx    Colon polyps Neg Hx    Rectal cancer Neg Hx     Social History   Socioeconomic History   Marital status: Married    Spouse name: Not on file   Number of children: 3   Years of education: Not on file   Highest education level: Not on file  Occupational History   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  Tobacco Use   Smoking status: Former    Types: Cigarettes   Smokeless tobacco: Never   Tobacco comments:    Smoked intermittently up to 5 cig per day for 10-15 years, quit 5 years ago  Scientific laboratory technician Use: Never used  Substance and Sexual Activity   Alcohol use: Yes    Comment: Occasional Drinks wine   Drug use: Never   Sexual activity: Yes  Other Topics Concern   Not on file  Social History Narrative   Right Handed    Lives in a one story home    Social Determinants of Health   Financial Resource Strain: Not on file  Food Insecurity: Not on file  Transportation Needs: Not on file  Physical Activity: Not on file  Stress: Not on file  Social Connections: Not on file  Intimate Partner Violence: Not At Risk (07/15/2021)   Humiliation, Afraid, Rape, and Kick questionnaire    Fear of Current or Ex-Partner: No    Emotionally Abused: No    Physically Abused: No    Sexually Abused: No    Review of Systems: All other review of systems negative except as mentioned in the HPI.  Physical Exam: Vital signs BP 122/78   Pulse 68   Temp (!) 96.8 F (36 C) (Temporal)   Ht 5' 4"$  (1.626 m)   Wt 160 lb (72.6 kg)   SpO2 100%   BMI 27.46 kg/m   General:   Alert,  Well-developed, pleasant and cooperative in NAD Lungs:  Clear throughout to auscultation.   Heart:  Regular rate and rhythm Abdomen:  Soft, nontender and  nondistended.   Neuro/Psych:  Alert and cooperative. Normal mood and affect. A and O x 3  Jolly Mango, MD [facility]

## 2022-07-01 NOTE — Progress Notes (Signed)
Pt's states no medical or surgical changes since previsit or office visit. 

## 2022-07-02 ENCOUNTER — Telehealth: Payer: Self-pay

## 2022-07-02 NOTE — Telephone Encounter (Signed)
Follow up call to pt, lm for pt to call if having any difficulty with normal activities or eating and drinking.  Also to call if any other questions or concerns. 

## 2022-07-29 ENCOUNTER — Telehealth: Payer: Self-pay | Admitting: Hematology

## 2022-07-29 NOTE — Telephone Encounter (Signed)
Contacted patient to scheduled appointments. Left message with appointment details and a call back number if patient had any questions or could not accommodate the time we provided.   

## 2022-08-10 ENCOUNTER — Other Ambulatory Visit: Payer: Self-pay

## 2022-08-13 ENCOUNTER — Telehealth: Payer: Self-pay | Admitting: Hematology

## 2022-08-13 ENCOUNTER — Other Ambulatory Visit: Payer: Self-pay

## 2022-08-13 DIAGNOSIS — C20 Malignant neoplasm of rectum: Secondary | ICD-10-CM

## 2022-08-16 ENCOUNTER — Ambulatory Visit (HOSPITAL_COMMUNITY): Payer: BC Managed Care – PPO

## 2022-08-16 ENCOUNTER — Inpatient Hospital Stay: Payer: BC Managed Care – PPO

## 2022-08-19 ENCOUNTER — Inpatient Hospital Stay: Payer: BC Managed Care – PPO | Admitting: Hematology

## 2022-08-20 ENCOUNTER — Ambulatory Visit: Payer: BC Managed Care – PPO | Admitting: Hematology

## 2022-08-20 ENCOUNTER — Other Ambulatory Visit: Payer: BC Managed Care – PPO

## 2022-08-25 ENCOUNTER — Encounter: Payer: Self-pay | Admitting: Hematology

## 2022-08-26 ENCOUNTER — Other Ambulatory Visit: Payer: Self-pay | Admitting: *Deleted

## 2022-08-30 ENCOUNTER — Inpatient Hospital Stay: Payer: BC Managed Care – PPO | Attending: Hematology

## 2022-08-30 ENCOUNTER — Ambulatory Visit (HOSPITAL_COMMUNITY): Payer: BC Managed Care – PPO

## 2022-09-02 ENCOUNTER — Telehealth: Payer: Self-pay | Admitting: Hematology

## 2022-09-02 NOTE — Telephone Encounter (Signed)
Patient left voicemail to cancel and reschedule her upcoming appointment, reached out to patient to reschedule, left voicemail.

## 2022-09-02 NOTE — Telephone Encounter (Signed)
Patient called back to reschedule appointment for after MRI and add labs.

## 2022-09-06 ENCOUNTER — Inpatient Hospital Stay: Payer: BC Managed Care – PPO | Admitting: Hematology

## 2022-09-09 ENCOUNTER — Ambulatory Visit (HOSPITAL_COMMUNITY)
Admission: RE | Admit: 2022-09-09 | Discharge: 2022-09-09 | Disposition: A | Discharge status: Home / Self Care | Payer: BC Managed Care – PPO | Source: Ambulatory Visit | Attending: Hematology | Admitting: Hematology

## 2022-09-09 DIAGNOSIS — C20 Malignant neoplasm of rectum: Secondary | ICD-10-CM | POA: Diagnosis present

## 2022-09-13 NOTE — Progress Notes (Unsigned)
[facility]   [phone] [phone]) [phone]   Clinic Follow up Note   Patient Care Team: Marcelle Overlie, MD as PCP - General (Obstetrics and Gynecology) Romie Levee, MD as Consulting Physician (General Surgery) Malachy Mood, MD as Consulting Physician (Hematology) Pollyann Samples, NP as Nurse Practitioner (Nurse Practitioner) Antony Madura, MD as Consulting Physician (Neurology)  Date of Service:  09/14/2022  CHIEF COMPLAINT: f/u of  rectal cancer   CURRENT THERAPY:  Surveillance   ASSESSMENT:  Kayla Hobbs is a 52 y.o. female with   Rectal cancer (HCC) G2, cT2N0M0 stage I -presented with rectal bleeding in 06/2021, present since 07/2019 but patient cancelled colonoscopy at that time. Colonoscopy on 06/18/21 by Dr. Adela Lank showed a palpable ~3 cm polyp, path revealed moderately differentiated adenocarcinoma invading submucosa, with close but negative margin. MMR normal. -Staging CT CAP on 07/02/21 was negative. -Staging pelvic MRI on 07/03/21 showed no identifiable lesion. -s/p concurrent chemoRT 07/15/21 - 09/02/21 with Xeloda. She tolerated treatment well with no significant side effects aside from irritation. -sigmoidoscopy on 11/25/21 showed small anal fissure, otherwise benign. -she saw Dr. Maisie Fus on 12/21/21, anoscope not performed at that time due to fissure. 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 -restaging pelvis MRI on 09/09/2022 showed NED, I reviewed and discussed with pt -lab reviewed, CEA pending, no other concerns  -She is clinically doing very well, we discussed cancer surveillance.  I recommend her to see Dr. Maisie Fus in 3 months, I will see her back in 6 months with repeated MRI.  Given her very early stage disease, I do not think she needs surveillance CT scan     PLAN: -lab reviewed -Recommend  f/u with Dr. Maisie Fus in 3 months with endoscope -Follow-up in 6 months  with lab and pelvic MRI a week before  SUMMARY OF ONCOLOGIC HISTORY: Oncology History Overview Note   Cancer Staging  Rectal cancer [facility]) Staging form: Colon and Rectum, AJCC 8th Edition - Clinical stage from 06/18/2021: Stage I (cT1, cN0, cM0) - Signed by Malachy Mood, MD on 07/11/2021 Stage prefix: Initial diagnosis Total positive nodes: 0 Histologic grade (G): G2 Histologic grading system: 4 grade system     Rectal cancer (HCC)  06/18/2021 Cancer Staging   Staging form: Colon and Rectum, AJCC 8th Edition - Clinical stage from 06/18/2021: Stage I (cT1, cN0, cM0) - Signed by Malachy Mood, MD on 07/11/2021 Stage prefix: Initial diagnosis Total positive nodes: 0 Histologic grade (G): G2 Histologic grading system: 4 grade system   06/18/2021 Procedure   Colonoscopy per Dr. Adela Lank, impression - Polypoid lesion found on digital rectal exam. - One diminutive polyp in the ascending colon, removed with a cold biopsy forceps. Resected and retrieved. - One 5 mm polyp in the sigmoid colon, removed with a cold snare. Resected and retrieved. - One large polyp in the distal rectum. Resected and retrieved as outlined. Injected/tattooed. Clips were placed. - Polypoid lesions in the distal rectum. Biopsied. - Internal hemorrhoids. - The examination was otherwise normal.   06/18/2021 Initial Biopsy   Diagnosis 1. Surgical [P], colon, distal rectal biopsy - CONDYLOMA ACUMINATA 2. Surgical [P], colon, ascending, sigmoid, polyp (2) - TUBULAR ADENOMA (2 OF 2 FRAGMENTS) - NO HIGH-GRADE DYSPLASIA OR MALIGNANCY IDENTIFIED 3. Surgical [P], colon, rectum, polyp (1) - ADENOCARCINOMA ARISING IN A TUBULAR ADENOMA WITH HIGH-GRADE DYSPLASIA - SEE COMMENT Microscopic Comment 3. The invasive adenocarcinoma appears moderately differentiated and invades  the submucosa for a depth of 0.8 cm. The adenocarcinoma comes within 0.1 cm from the base of the polyp. High-grade features including lymphovascular space invasion, tumor  budding or poor differentiation are not identified. Dr. Kenard Gower reviewed the case and agrees with the above diagnosis.   07/02/2021 Imaging   CT CAP IMPRESSION: Negative. No evidence of metastatic disease or other significant abnormality.   07/03/2021 Imaging   Pelvic MRI w/wo contrast IMPRESSION: Rectal adenocarcinoma T stage: T2.  Lesion not identified  Rectal adenocarcinoma N stage:  N0  Distance from tumor to the internal anal sphincter. Lesion not identified   07/08/2021 Initial Diagnosis   Rectal cancer [facility])      INTERVAL HISTORY:  Kayla Hobbs is here for a follow up of  rectal cancer . She was last seen by me  on 02/19/2023. She presents to the clinic alone. Pt state that she has no concerns. But she still has her FISSURE in her rectum. She said she still have pain from it.   All other systems were reviewed with the patient and are negative.  MEDICAL HISTORY:  Past Medical History:  Diagnosis Date   Arthritis    Hypertension    Rectal cancer (HCC)    Skin cancer    basil top of heasd     SURGICAL HISTORY: Past Surgical History:  Procedure Laterality Date   PARTIAL HYSTERECTOMY  05/2020   due to heavy bleeding   SKIN CANCER EXCISION      I have reviewed the social history and family history with the patient and they are unchanged from previous note.  ALLERGIES:  has No Known Allergies.  MEDICATIONS:  Current Outpatient Medications  Medication Sig Dispense Refill   AMBULATORY NON FORMULARY MEDICATION Place 30 g rectally 3 (three) times daily as needed. Medication Name: Diltiazem 2% and Lidocaine 5% ointment. Apply pea sized amount 3 times daily. 30 g 0   AMBULATORY NON FORMULARY MEDICATION Medication Name: nitrogel  use three times a day per rectum per rectal fissure 30 g 0   buPROPion (WELLBUTRIN SR) 150 MG 12 hr tablet Take 150 mg by mouth 2 (two) times daily.     busPIRone (BUSPAR) 15 MG tablet Take 15 mg by mouth daily in the afternoon.     estradiol  (ESTRACE) 0.5 MG tablet Take 0.5 mg by mouth daily.     fluticasone (FLONASE) 50 MCG/ACT nasal spray Place 2 sprays into both nostrils daily. (Patient not taking: Reported on 06/11/2022) 16 g 0   hydrochlorothiazide (HYDRODIURIL) 50 MG tablet Take 1 tablet by mouth daily.     ibuprofen (ADVIL) 800 MG tablet      potassium chloride SA (KLOR-CON M20) 20 MEQ tablet Take 2 tablets (40 mEq total) by mouth 2 (two) times daily. 120 tablet 0   propranolol (INDERAL) 20 MG tablet Take 2 tablets (40 mg total) by mouth daily. 60 tablet 6   rizatriptan (MAXALT) 10 MG tablet Take 1 tablet (10 mg total) by mouth as needed for migraine. May repeat in 2 hours if needed 10 tablet 0   No current facility-administered medications for this visit.    PHYSICAL EXAMINATION: ECOG PERFORMANCE STATUS: 0 - Asymptomatic  Vitals:   09/14/22 0806  BP: (!) 135/94  Pulse: 62  Resp: 13  Temp: 97.7 F (36.5 C)  SpO2: 98%   Wt Readings from Last 3 Encounters:  09/14/22 175 lb 14.4 oz (79.8 kg)  07/01/22 160 lb (72.6 kg)  06/11/22 160 lb (72.6  kg)    GENERAL:alert, no distress and comfortable SKIN: skin color normal, no rashes or significant lesions EYES: normal, Conjunctiva are pink and non-injected, sclera clear  NEURO: alert & oriented x 3 with fluent speech NECK: (-) supple, thyroid normal size, non-tender, without nodularity LYMPH:  (-) no palpable lymphadenopathy in the cervical, axillary  LUNGS: clear to auscultation and percussion with normal breathing effort HEART:(-) regular rate & rhythm and no murmurs and(-)  no lower extremity edema ABDOMEN:(-)abdomen soft, (-) non-tender and (-)normal bowel sounds  LABORATORY DATA:  I have reviewed the data as listed    Latest Ref Rng & Units 09/14/2022    7:45 AM 02/15/2022   11:42 AM 11/18/2021   12:55 PM  CBC  WBC 4.0 - 10.5 K/uL 4.6  3.2  4.3   Hemoglobin 12.0 - 15.0 g/dL 16.1  09.6  04.5   Hematocrit 36.0 - 46.0 % 40.6  40.9  43.0   Platelets 150 - 400 K/uL  215  220  220         Latest Ref Rng & Units 09/14/2022    7:45 AM 02/15/2022   11:42 AM 11/18/2021   12:55 PM  CMP  Glucose 70 - 99 mg/dL 92  409  76   BUN 6 - 20 mg/dL 13  13  15    Creatinine 0.44 - 1.00 mg/dL 8.11  9.14  7.82   Sodium 135 - 145 mmol/L 140  143  140   Potassium 3.5 - 5.1 mmol/L 3.3  2.9  4.0   Chloride 98 - 111 mmol/L 104  104  105   CO2 22 - 32 mmol/L 30  31  29    Calcium 8.9 - 10.3 mg/dL 8.8  9.2  9.5   Total Protein 6.5 - 8.1 g/dL 6.6  6.9  7.0   Total Bilirubin 0.3 - 1.2 mg/dL 0.4  0.3  0.3   Alkaline Phos 38 - 126 U/L 76  79  69   AST 15 - 41 U/L 13  14  14    ALT 0 - 44 U/L 11  12  13        RADIOGRAPHIC STUDIES: I have personally reviewed the radiological images as listed and agreed with the findings in the report. No results found.    Orders Placed This Encounter  Procedures   MR Pelvis W Wo Contrast    Rectal Cancer restaging    Standing Status:   Future    Standing Expiration Date:   09/14/2023    Order Specific Question:   If indicated for the ordered procedure, I authorize the administration of contrast media per Radiology protocol    Answer:   Yes    Order Specific Question:   What is the patient's sedation requirement?    Answer:   No Sedation    Order Specific Question:   Does the patient have a pacemaker or implanted devices?    Answer:   No    Order Specific Question:   Preferred imaging location?    Answer:   [facility] (table limit - 550 lbs)    Order Specific Question:   Release to patient    Answer:   Immediate   All questions were answered. The patient knows to call the clinic with any problems, questions or concerns. No barriers to learning was detected. The total time spent in the appointment was 25 minutes.     Malachy Mood, MD 09/14/2022   Carolin Coy, CMA, am  acting as scribe for Yan Feng, MD.   I have reviewed the above documentation for accuracy and completeness, and I agree with the above.     

## 2022-09-14 ENCOUNTER — Other Ambulatory Visit: Payer: Self-pay

## 2022-09-14 ENCOUNTER — Inpatient Hospital Stay: Payer: BC Managed Care – PPO

## 2022-09-14 ENCOUNTER — Inpatient Hospital Stay: Payer: BC Managed Care – PPO | Attending: Hematology | Admitting: Hematology

## 2022-09-14 VITALS — BP 135/94 | HR 62 | Temp 97.7°F | Resp 13 | Wt 175.9 lb

## 2022-09-14 DIAGNOSIS — K603 Anal fistula: Secondary | ICD-10-CM | POA: Insufficient documentation

## 2022-09-14 DIAGNOSIS — C20 Malignant neoplasm of rectum: Secondary | ICD-10-CM | POA: Diagnosis not present

## 2022-09-14 DIAGNOSIS — K602 Anal fissure, unspecified: Secondary | ICD-10-CM | POA: Diagnosis not present

## 2022-09-14 DIAGNOSIS — Z79899 Other long term (current) drug therapy: Secondary | ICD-10-CM | POA: Diagnosis not present

## 2022-09-14 LAB — CBC WITH DIFFERENTIAL (CANCER CENTER ONLY)
Abs Immature Granulocytes: 0.02 10*3/uL (ref 0.00–0.07)
Basophils Absolute: 0 10*3/uL (ref 0.0–0.1)
Basophils Relative: 1 %
Eosinophils Absolute: 0.2 10*3/uL (ref 0.0–0.5)
Eosinophils Relative: 4 %
HCT: 40.6 % (ref 36.0–46.0)
Hemoglobin: 14.1 g/dL (ref 12.0–15.0)
Immature Granulocytes: 0 %
Lymphocytes Relative: 19 %
Lymphs Abs: 0.9 10*3/uL (ref 0.7–4.0)
MCH: 32 pg (ref 26.0–34.0)
MCHC: 34.7 g/dL (ref 30.0–36.0)
MCV: 92.3 fL (ref 80.0–100.0)
Monocytes Absolute: 0.6 10*3/uL (ref 0.1–1.0)
Monocytes Relative: 12 %
Neutro Abs: 2.9 10*3/uL (ref 1.7–7.7)
Neutrophils Relative %: 64 %
Platelet Count: 215 10*3/uL (ref 150–400)
RBC: 4.4 MIL/uL (ref 3.87–5.11)
RDW: 12.9 % (ref 11.5–15.5)
WBC Count: 4.6 10*3/uL (ref 4.0–10.5)
nRBC: 0 % (ref 0.0–0.2)

## 2022-09-14 LAB — CMP (CANCER CENTER ONLY)
ALT: 11 U/L (ref 0–44)
AST: 13 U/L — ABNORMAL LOW (ref 15–41)
Albumin: 4.1 g/dL (ref 3.5–5.0)
Alkaline Phosphatase: 76 U/L (ref 38–126)
Anion gap: 6 (ref 5–15)
BUN: 13 mg/dL (ref 6–20)
CO2: 30 mmol/L (ref 22–32)
Calcium: 8.8 mg/dL — ABNORMAL LOW (ref 8.9–10.3)
Chloride: 104 mmol/L (ref 98–111)
Creatinine: 0.93 mg/dL (ref 0.44–1.00)
GFR, Estimated: >60 mL/min (ref >60)
Glucose, Bld: 92 mg/dL (ref 70–99)
Potassium: 3.3 mmol/L — ABNORMAL LOW (ref 3.5–5.1)
Sodium: 140 mmol/L (ref 135–145)
Total Bilirubin: 0.4 mg/dL (ref 0.3–1.2)
Total Protein: 6.6 g/dL (ref 6.5–8.1)

## 2022-09-14 LAB — CEA (ACCESS): CEA (CHCC): 1.42 ng/mL (ref 0.00–5.00)

## 2022-09-14 NOTE — Assessment & Plan Note (Signed)
G2, cT2N0M0 stage I -presented with rectal bleeding in 06/2021, present since 07/2019 but patient cancelled colonoscopy at that time. Colonoscopy on 06/18/21 by Dr. Adela Lank showed a palpable ~3 cm polyp, path revealed moderately differentiated adenocarcinoma invading submucosa, with close but negative margin. MMR normal. -Staging CT CAP on 07/02/21 was negative. -Staging pelvic MRI on 07/03/21 showed no identifiable lesion. -s/p concurrent chemoRT 07/15/21 - 09/02/21 with Xeloda. She tolerated treatment well with no significant side effects aside from irritation. -sigmoidoscopy on 11/25/21 showed small anal fissure, otherwise benign. -she saw Dr. Maisie Fus on 12/21/21, anoscope not performed at that time due to fissure. -restaging pelvis MRI on 02/15/22 showed NED.

## 2022-09-23 ENCOUNTER — Other Ambulatory Visit: Payer: Self-pay | Admitting: Neurology

## 2022-09-23 DIAGNOSIS — G43009 Migraine without aura, not intractable, without status migrainosus: Secondary | ICD-10-CM

## 2022-10-29 NOTE — Progress Notes (Deleted)
NEUROLOGY FOLLOW UP OFFICE NOTE  Kayla Hobbs [id number]  Subjective:  Kayla Hobbs is a 52 y.o. year old right-handed female with a medical history of anxiety, moderately differentiated rectal adenocarcinoma (stage I) s/p radiation and chemotherapy (Xeloda), HTN, OA, former smoker who we last saw on 05/13/22.  To briefly review: Initial consult (02/23/22): Patient initially had headaches about 10 years ago. At that time, patient would have neck pain and occipital region headaches. She felt tense and tight in the neck and felt that may have been the cause. These would last a few days to a whole day. She does not remember the headache frequency. She saw someone who gave her something, but she does not remember the details. She was even given a shot in the office. She had none of these type of headaches for at least 6 years.   She started having headaches again about 3 months ago. She gets headaches multiple times per week (at least 4 times per week). The headache can last hours to a full day. They are located behind the eyes and across the forehead. Sometimes it is a dull ache. She does not endorse significant sinus pressure or sinus symptoms. She endorses photophobia, but denies phonophobia, nausea or vomiting. She denies aura or focal weakness, numbness associated with it. Her headaches gradually increase in intensity to max after about 30 minutes. She never gets woken from sleep. It does not change with position change. She prefers not to move during a headache though. She has not identified a clear trigger. She tries to take alleve or tylenol which does not help it much. She thinks she takes an over the counter NSAID about 4 times per week. She was prescribed sumatriptan 50 mg PRN. She took it once and didn't like the way it felt. She lost it and has not been able to retry it. She does not recall ever being on a medication to prevent headaches.   She does not currently take any vitamins.    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   Other notable medications: wellbutrin, buspar, estrogen (been on for about 1.5 years after hysterectomy)   Caffeine:  1 soda per day Alcohol:  occasional glass of wine (every other day) Smoker:  former, quit about 7 years ago Diet:  Regular diet Exercise:  not a lot, fatigue due to cancer treatments Depression:  not significant; Anxiety: quite a bit of worrying due to cancer Other pain:  Neck pain from arthritis Sleep hygiene:  pretty good most days Family history of headache:  none  05/13/22: CT head was normal. Labs were also normal.   In terms of patient's headaches, she is having less headaches and when she does have headaches, they are not as severe. She has also tried the OTC vitamins, but does not remember which. She only takes propranolol in the morning as she does not like taking night medications. She denies any medication side effects.   She has about 1 headache per month. She does not take maxalt with every headache. She has taken maxalt about 3 total times. She thinks this eases the headache some.   She denies any new symptoms.  Most recent Assessment and Plan (05/13/22): This is Kayla Hobbs, a 52 y.o. female with headaches and neck pain. Patient has improved in terms of headache frequency and intensity with migraine preventative and abortive medications.   Plan: -PT for neck pain -OTC  vitamins to take for headaches given (magnesium, riboflavin, feverfew, Co-Q10, melatonin, ginger) -Limit OTC NSAIDs as able, ideally less than 10 doses per month -propranolol 40 mg in am for headache prevention (change from 20 mg BID) -maxalt 10 mg PRN for abortive therapy, can repeat after 2 hours if needed  Since their last visit: ***  PT?***  MEDICATIONS:  Outpatient Encounter Medications as of 11/12/2022  Medication Sig   AMBULATORY NON FORMULARY MEDICATION Place 30 g rectally 3  (three) times daily as needed. Medication Name: Diltiazem 2% and Lidocaine 5% ointment. Apply pea sized amount 3 times daily.   AMBULATORY NON FORMULARY MEDICATION Medication Name: nitrogel  use three times a day per rectum per rectal fissure   buPROPion (WELLBUTRIN SR) 150 MG 12 hr tablet Take 150 mg by mouth 2 (two) times daily.   busPIRone (BUSPAR) 15 MG tablet Take 15 mg by mouth daily in the afternoon.   estradiol (ESTRACE) 0.5 MG tablet Take 0.5 mg by mouth daily.   fluticasone (FLONASE) 50 MCG/ACT nasal spray Place 2 sprays into both nostrils daily. (Patient not taking: Reported on 06/11/2022)   hydrochlorothiazide (HYDRODIURIL) 50 MG tablet Take 1 tablet by mouth daily.   ibuprofen (ADVIL) 800 MG tablet    potassium chloride SA (KLOR-CON M20) 20 MEQ tablet Take 2 tablets (40 mEq total) by mouth 2 (two) times daily.   propranolol (INDERAL) 20 MG tablet Take 2 tablets (40 mg total) by mouth daily.   rizatriptan (MAXALT) 10 MG tablet TAKE 1 TABLET BY MOUTH AS NEEDED FOR MIGRAINE. MAY REPEAT IN 2 HOURS IF NEEDED   No facility-administered encounter medications on file as of 11/12/2022.    PAST MEDICAL HISTORY: Past Medical History:  Diagnosis Date   Arthritis    Hypertension    Rectal cancer (HCC)    Skin cancer    basil top of heasd     PAST SURGICAL HISTORY: Past Surgical History:  Procedure Laterality Date   PARTIAL HYSTERECTOMY  05/2020   due to heavy bleeding   SKIN CANCER EXCISION      ALLERGIES: No Known Allergies  FAMILY HISTORY: Family History  Problem Relation Age of Onset   Heart disease Father    Stroke Father    Cancer Brother        liver cancer possibly HCC   Cancer Maternal Grandmother        pancreas   Pancreatic cancer Maternal Grandmother    Diabetes Paternal Grandmother    Heart disease Paternal Grandfather    Cancer Cousin        leukemia   Colon cancer Neg Hx    Stomach cancer Neg Hx    Esophageal cancer Neg Hx    Colon polyps Neg Hx     Rectal cancer Neg Hx     SOCIAL HISTORY: Social History   Tobacco Use   Smoking status: Former    Types: Cigarettes   Smokeless tobacco: Never   Tobacco comments:    Smoked intermittently up to 5 cig per day for 10-15 years, quit 5 years ago  Vaping Use   Vaping Use: Never used  Substance Use Topics   Alcohol use: Yes    Comment: Occasional Drinks wine   Drug use: Never   Social History   Social History Narrative   Right Handed    Lives in a one story home       Objective:  Vital Signs:  There were no vitals taken for this visit.  ***  Labs and Imaging review: New results: 09/14/22: CMP significant for K 3.3 (chronic) CBC unremarkable CEA wnl  Follow up MRI pelvis (09/09/22) with no evidence of recurrent rectal carcinoma  Previously reviewed results: 02/23/22: B12: 428 TSH: 2.08 Mg: 1.7 Vit D: 45.05    CT head wo contrast (03/30/22): FINDINGS: Brain: No evidence of acute infarction, hemorrhage, hydrocephalus, extra-axial collection or mass lesion/mass effect.   Vascular: No hyperdense vessel or unexpected calcification.   Skull: No fracture or focal lesion.   Sinuses/Orbits: Paranasal sinuses and mastoid air cells are clear. The visualized orbits are unremarkable.   Other: None.   IMPRESSION: Normal head CT.  No explanation for headache.   Assessment/Plan:  This is Kayla Hobbs, a 52 y.o. female with: ***   Plan: ***  Return to clinic in ***  Total time spent reviewing records, interview, history/exam, documentation, and coordination of care on day of encounter:  *** min  Jacquelyne Balint, MD

## 2022-11-02 ENCOUNTER — Encounter: Payer: Self-pay | Admitting: Hematology

## 2022-11-09 ENCOUNTER — Telehealth: Payer: Self-pay | Admitting: Gastroenterology

## 2022-11-09 NOTE — Telephone Encounter (Signed)
Inbound call from patient requesting a refill for" nitrogel:".PLease advise

## 2022-11-12 ENCOUNTER — Encounter: Payer: Self-pay | Admitting: Neurology

## 2022-11-12 ENCOUNTER — Ambulatory Visit: Payer: BC Managed Care – PPO | Admitting: Neurology

## 2022-11-12 DIAGNOSIS — Z029 Encounter for administrative examinations, unspecified: Secondary | ICD-10-CM

## 2022-11-16 ENCOUNTER — Other Ambulatory Visit: Payer: Self-pay

## 2022-11-16 DIAGNOSIS — C20 Malignant neoplasm of rectum: Secondary | ICD-10-CM

## 2022-11-16 MED ORDER — AMBULATORY NON FORMULARY MEDICATION
30.0000 g | Freq: Three times a day (TID) | 1 refills | Status: DC
Start: 2022-11-16 — End: 2022-11-16

## 2022-11-16 MED ORDER — AMBULATORY NON FORMULARY MEDICATION
30.00 g | Freq: Three times a day (TID) | 1 refills | Status: DC
Start: 2022-11-16 — End: 2022-11-16

## 2022-11-16 MED ORDER — AMBULATORY NON FORMULARY MEDICATION: 30.0000 g | Freq: Three times a day (TID) | 1 refills | Status: AC

## 2022-12-19 ENCOUNTER — Other Ambulatory Visit: Payer: Self-pay | Admitting: Neurology

## 2022-12-19 DIAGNOSIS — G43009 Migraine without aura, not intractable, without status migrainosus: Secondary | ICD-10-CM

## 2022-12-20 IMAGING — MR MR PELVIS WO/W CM
8 of 9 series · 41 of 48 positions shown · non-contrast
Comparison: None.

CONTRAST:  7 mL Gadavist

CLINICAL DATA: Large polypoid lesion removed from the rectum.
Lesion reported 2-3 cm from the dentate line.

EXAM:
MRI PELVIS WITH and  WITHOUT CONTRAST
TECHNIQUE: Multiplanar multisequence MR imaging of the pelvis was performed.
Without contrast ultrasound gel was administered per rectum to
optimize tumor evaluation.

[Series 2: T2 · sagittal · 3.0mm · 0.66mm/px · 5 of 40 slices shown (1 of 5)]
[im 1/40]
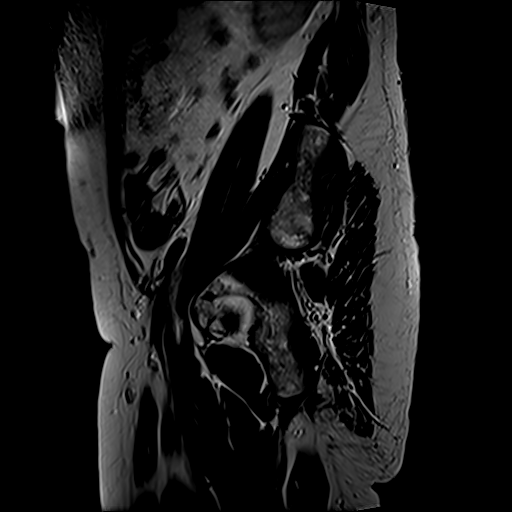
[im 10/40]
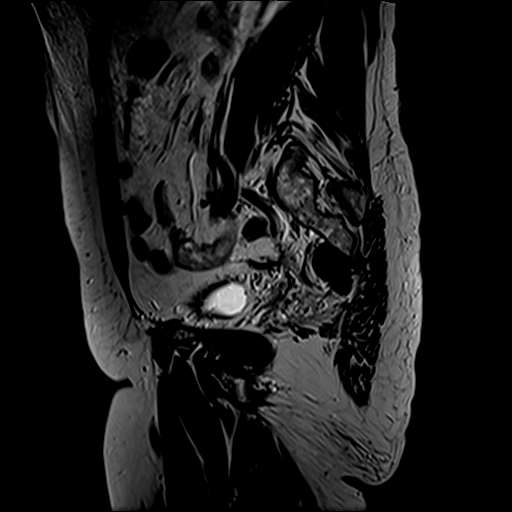
[im 20/40]
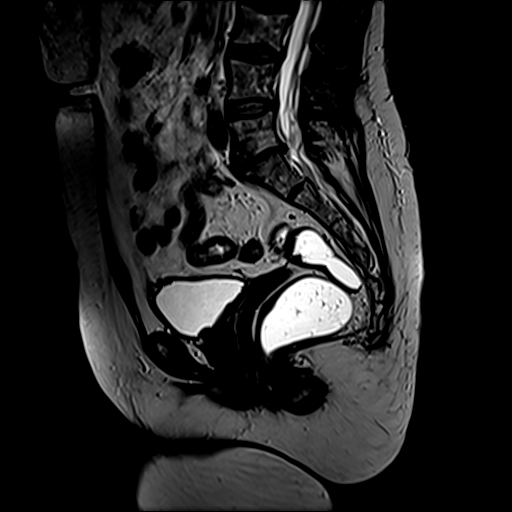
[im 30/40]
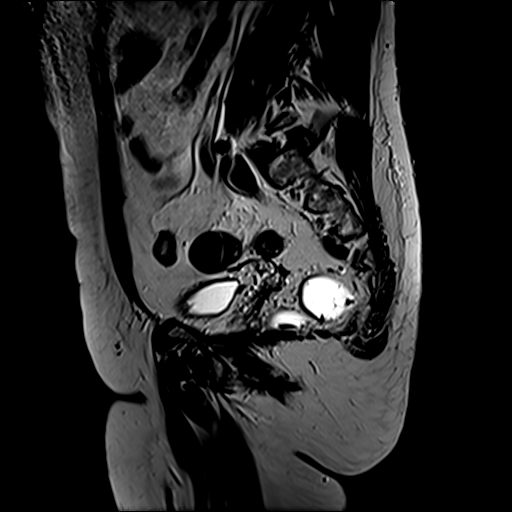
[im 40/40]
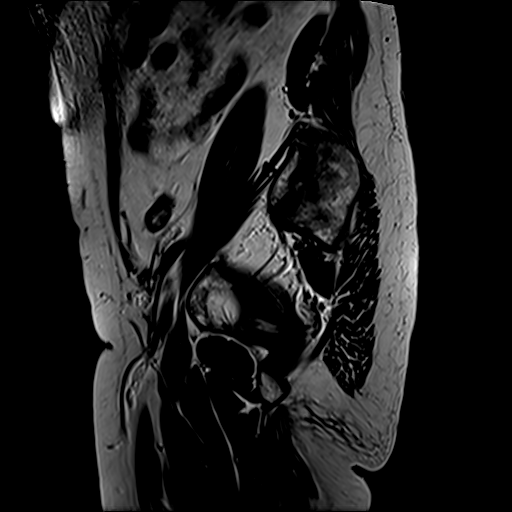

[Series 3: T2 · axial · 5.0mm · 0.89mm/px · z∈[-39,+176]mm · 4 of 34 slices shown (2 of 5)]
[im 1/34]
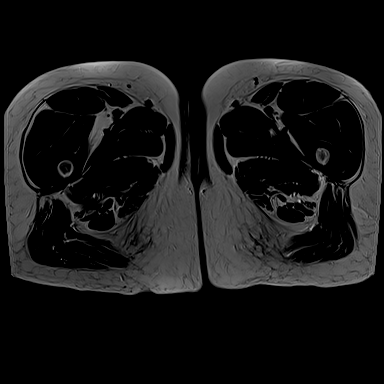
[im 12/34]
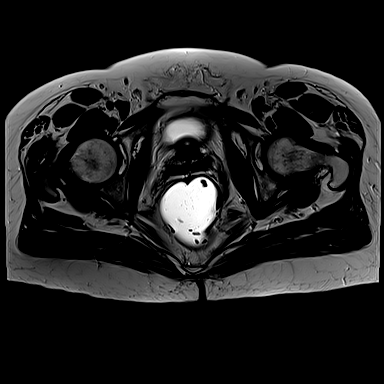
[im 23/34]
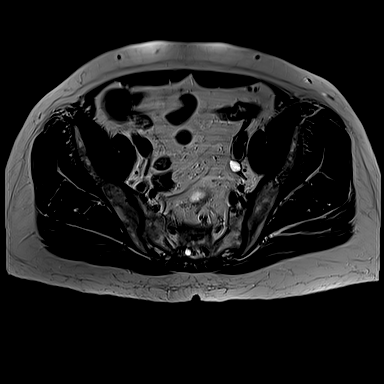
[im 34/34]
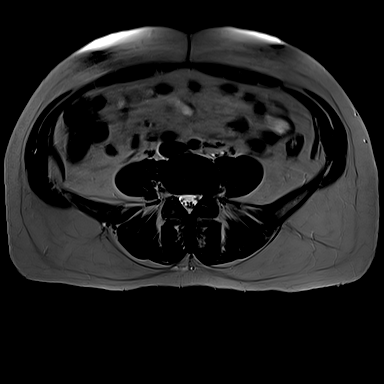

[Series 4: T2 · coronal · 3.0mm · 0.70mm/px · 6 of 47 slices shown (3 of 5)]
[im 1/47]
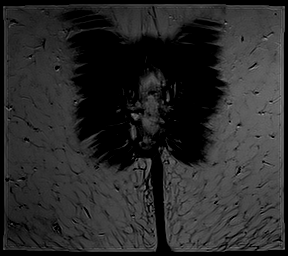
[im 10/47]
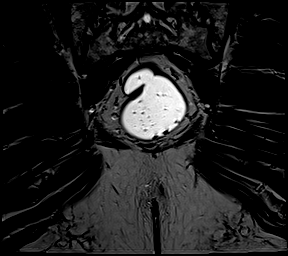
[im 19/47]
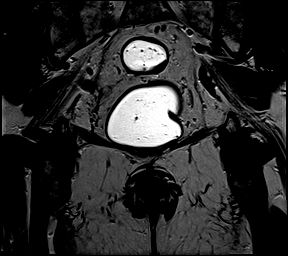
[im 28/47]
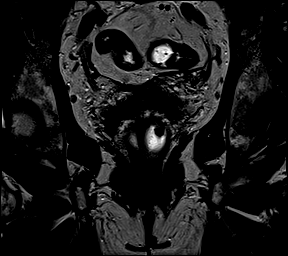
[im 37/47]
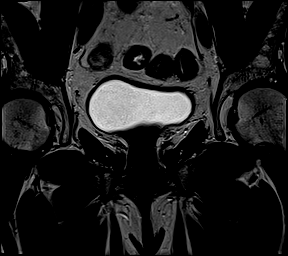
[im 47/47]
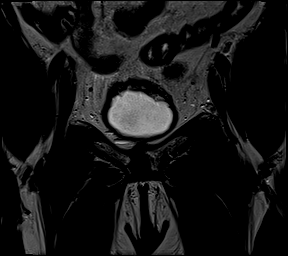

[Series 5: DWI · axial · 5.0mm · 1.41mm/px · z∈[-35,+175]mm · 9 of 72 slices shown (1 of 2)]
[im 1/72]
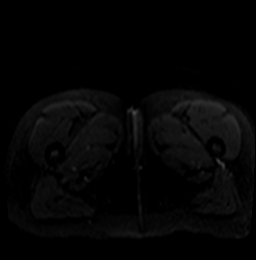
[im 9/72]
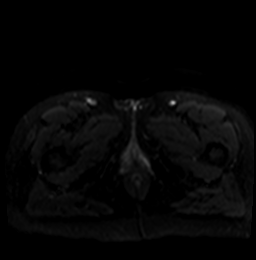
[im 18/72]
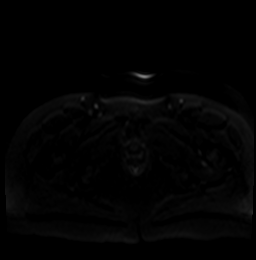
[im 27/72]
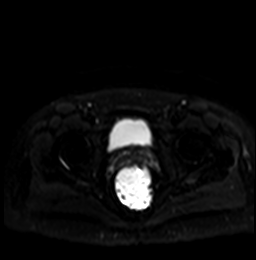
[im 36/72]
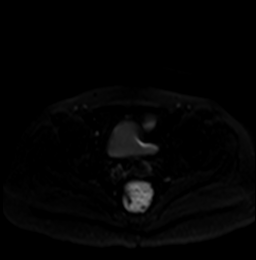
[im 45/72]
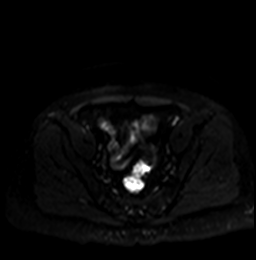
[im 54/72]
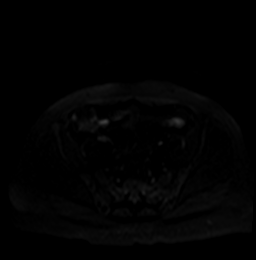
[im 63/72]
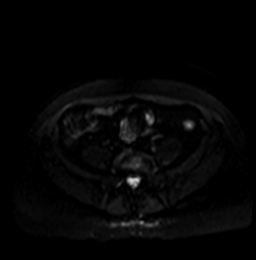
[im 72/72]
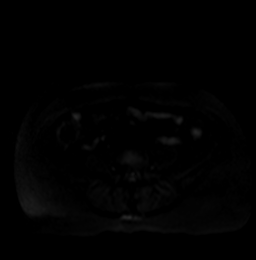

[Series 6: DWI · axial · 5.0mm · 1.41mm/px · z∈[-35,+175]mm · 4 of 36 slices shown (2 of 2)]
[im 1/36]
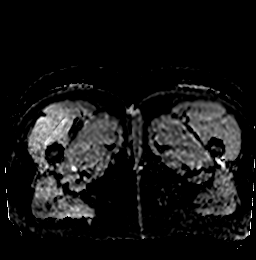
[im 12/36]
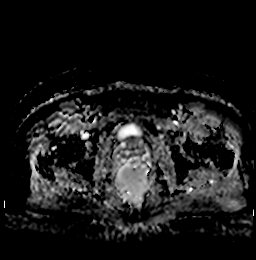
[im 24/36]
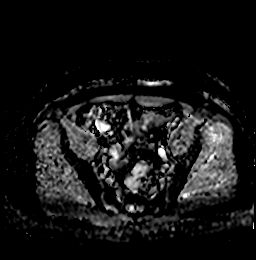
[im 36/36]
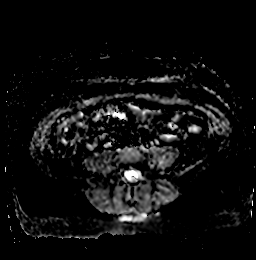

[Series 7: T2 · coronal · 3.0mm · 0.70mm/px · 4 of 34 slices shown (4 of 5)]
[im 1/34]
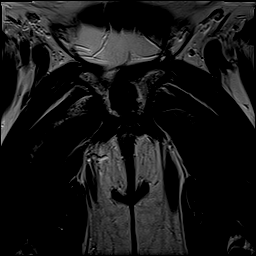
[im 12/34]
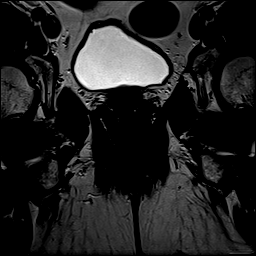
[im 23/34]
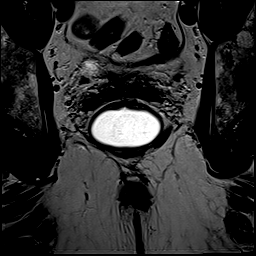
[im 34/34]
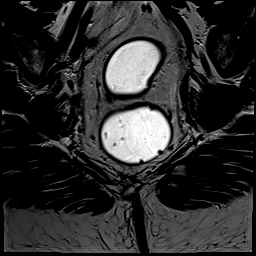

[Series 8: T2 · oblique · 3.0mm · 0.70mm/px · 6 of 52 slices shown (5 of 5)]
[im 1/52]
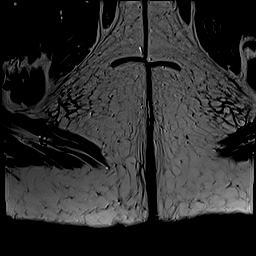
[im 11/52]
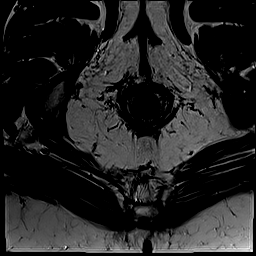
[im 21/52]
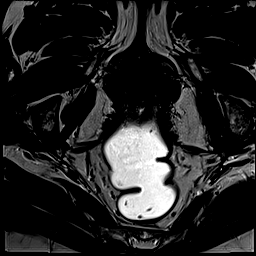
[im 31/52]
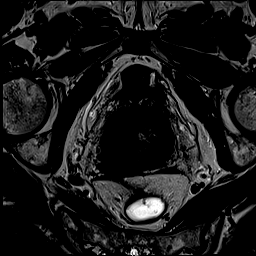
[im 41/52]
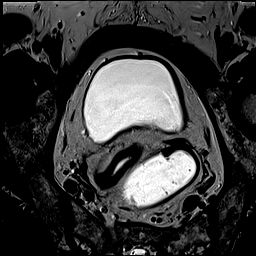
[im 52/52]
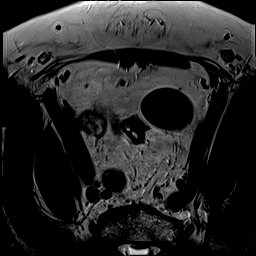

[Series 9: T1 fat-sat post-contrast · oblique · 3.0mm · 0.43mm/px · 3 of 52 slices shown]
[im 1/52]
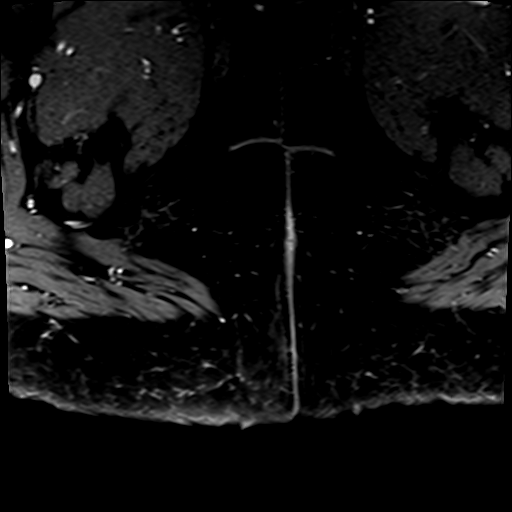
[im 11/52]
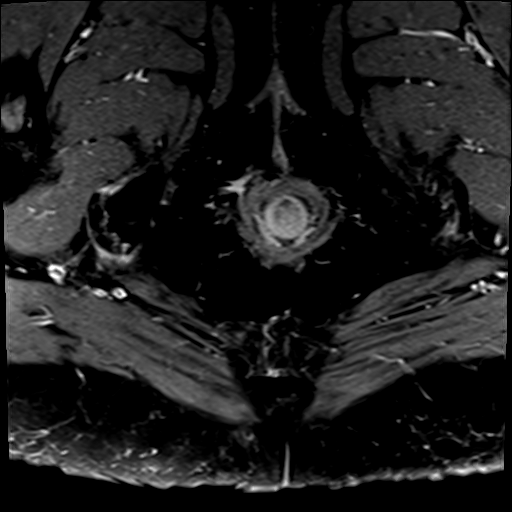
[im 21/52]
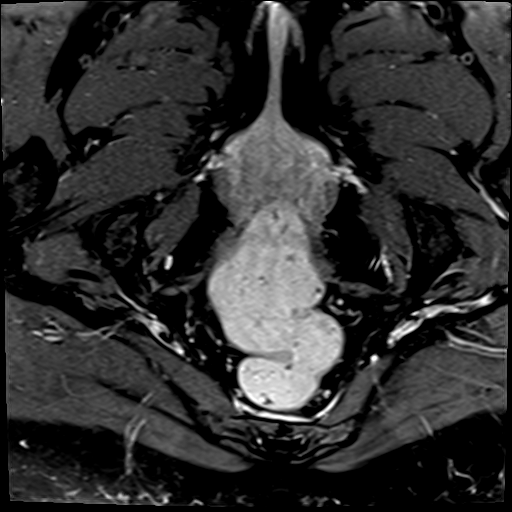

[41 of 48 positions shown; findings below may reference images not displayed]

FINDINGS: TUMOR LOCATION

No discrete lesion identified following polypectomy. Polypectomy
within 2-3 cm the dentate line by report.

Tumor distance from Anal Verge/Skin Surface: Lesion not identified

Tumor distance to Internal Anal Sphincter: Lesion not identified

TUMOR DESCRIPTION

Circumferential Extent: Lesion knots identified

Tumor Length: Lesion not identified cm

T - CATEGORY

Extension through Muscularis Propria: No equal T2

Shortest Distance of any tumor/node from Mesorectal Fascia: Lesion
not identified mm

Extramural Vascular Invasion/Tumor Thrombus: No

Invasion of Anterior Peritoneal Reflection: No

Involvement of Adjacent Organs or Pelvic Sidewall: No

Levator Ani Involvement: No

N - CATEGORY

Mesorectal Lymph Nodes >=5mm: None equal in 0

Extra-mesorectal Lymphadenopathy: No

Other: Small 10 mm cyst in the LEFT adnexal region. Post
hysterectomy
IMPRESSION: Rectal adenocarcinoma T stage: T2.  Lesion not identified

Rectal adenocarcinoma N stage:  N0

Distance from tumor to the internal anal sphincter. Lesion not
identified

## 2023-03-04 ENCOUNTER — Other Ambulatory Visit: Payer: Self-pay

## 2023-03-04 DIAGNOSIS — C20 Malignant neoplasm of rectum: Secondary | ICD-10-CM

## 2023-03-04 NOTE — Progress Notes (Signed)
Notified via Secure Chat by Manning Charity on the Revenue Cycle Team that [employer] will only approve diagnostic test to be done at [facility].  Modified order to [facility].  Order sent to electronically to [facility].  Appt for MRI at [facility] canceled.  Pt is aware of appt cancellation and will contact GI on Monday 03/07/2023 to schedule appt.

## 2023-03-08 ENCOUNTER — Other Ambulatory Visit: Payer: BC Managed Care – PPO

## 2023-03-09 ENCOUNTER — Inpatient Hospital Stay
Admission: RE | Admit: 2023-03-09 | Discharge: 2023-03-09 | Discharge status: Home / Self Care | Payer: BC Managed Care – PPO | Source: Ambulatory Visit | Attending: Hematology

## 2023-03-09 DIAGNOSIS — C20 Malignant neoplasm of rectum: Secondary | ICD-10-CM

## 2023-03-09 MED ORDER — GADOBENATE DIMEGLUMINE 529 MG/ML IV SOLN
10.0000 mL | Freq: Once | INTRAVENOUS | Status: AC | PRN
  Administered 2023-03-09: 8 mL via INTRAVENOUS

## 2023-03-10 ENCOUNTER — Ambulatory Visit (HOSPITAL_COMMUNITY): Payer: BC Managed Care – PPO

## 2023-03-10 ENCOUNTER — Inpatient Hospital Stay: Payer: BC Managed Care – PPO | Attending: Hematology

## 2023-03-10 DIAGNOSIS — K602 Anal fissure, unspecified: Secondary | ICD-10-CM | POA: Diagnosis not present

## 2023-03-10 DIAGNOSIS — C20 Malignant neoplasm of rectum: Secondary | ICD-10-CM | POA: Insufficient documentation

## 2023-03-10 LAB — CMP (CANCER CENTER ONLY)
ALT: 15 U/L (ref 0–44)
AST: 14 U/L — ABNORMAL LOW (ref 15–41)
Albumin: 4.2 g/dL (ref 3.5–5.0)
Alkaline Phosphatase: 85 U/L (ref 38–126)
Anion gap: 8 (ref 5–15)
BUN: 15 mg/dL (ref 6–20)
CO2: 31 mmol/L (ref 22–32)
Calcium: 9.6 mg/dL (ref 8.9–10.3)
Chloride: 105 mmol/L (ref 98–111)
Creatinine: 0.97 mg/dL (ref 0.44–1.00)
GFR, Estimated: >60 mL/min (ref >60)
Glucose, Bld: 107 mg/dL — ABNORMAL HIGH (ref 70–99)
Potassium: 2.8 mmol/L — ABNORMAL LOW (ref 3.5–5.1)
Sodium: 144 mmol/L (ref 135–145)
Total Bilirubin: 0.4 mg/dL (ref 0.3–1.2)
Total Protein: 7.1 g/dL (ref 6.5–8.1)

## 2023-03-10 LAB — CBC WITH DIFFERENTIAL (CANCER CENTER ONLY)
Abs Immature Granulocytes: 0.01 10*3/uL (ref 0.00–0.07)
Basophils Absolute: 0 10*3/uL (ref 0.0–0.1)
Basophils Relative: 1 %
Eosinophils Absolute: 0.1 10*3/uL (ref 0.0–0.5)
Eosinophils Relative: 3 %
HCT: 42.4 % (ref 36.0–46.0)
Hemoglobin: 14.7 g/dL (ref 12.0–15.0)
Immature Granulocytes: 0 %
Lymphocytes Relative: 24 %
Lymphs Abs: 1 10*3/uL (ref 0.7–4.0)
MCH: 31 pg (ref 26.0–34.0)
MCHC: 34.7 g/dL (ref 30.0–36.0)
MCV: 89.5 fL (ref 80.0–100.0)
Monocytes Absolute: 0.4 10*3/uL (ref 0.1–1.0)
Monocytes Relative: 11 %
Neutro Abs: 2.5 10*3/uL (ref 1.7–7.7)
Neutrophils Relative %: 61 %
Platelet Count: 249 10*3/uL (ref 150–400)
RBC: 4.74 MIL/uL (ref 3.87–5.11)
RDW: 12.9 % (ref 11.5–15.5)
WBC Count: 4.1 10*3/uL (ref 4.0–10.5)
nRBC: 0 % (ref 0.0–0.2)

## 2023-03-10 LAB — CEA (ACCESS): CEA (CHCC): 2 ng/mL (ref 0.00–5.00)

## 2023-03-11 ENCOUNTER — Telehealth: Payer: Self-pay

## 2023-03-11 NOTE — Telephone Encounter (Addendum)
Called patient to relay messge below as per Dr. Mosetta Putt, patient voiced full understanding she stated she was not taking the Potassium on a regular basis so I informed her to needs to be taking it as prescribed. She voiced full understanding. She wanted to know if she needs labs before her appointment on 11/7.   ----- Message from Malachy Mood sent at 03/10/2023  8:38 PM EDT ----- Please let pt know her K is low again, probably related to her BP med hydrochlorothiazide, please make sure she is taking oral K and increase at least daily, let her contact her PCP to see if she can change her BP meds.   Malachy Mood

## 2023-03-14 ENCOUNTER — Other Ambulatory Visit: Payer: Self-pay | Admitting: Hematology

## 2023-03-15 ENCOUNTER — Telehealth: Payer: Self-pay | Admitting: Hematology

## 2023-03-17 ENCOUNTER — Inpatient Hospital Stay: Payer: BC Managed Care – PPO

## 2023-03-17 ENCOUNTER — Other Ambulatory Visit: Payer: Self-pay | Admitting: Hematology

## 2023-03-17 ENCOUNTER — Other Ambulatory Visit: Payer: Self-pay

## 2023-03-17 ENCOUNTER — Inpatient Hospital Stay: Payer: BC Managed Care – PPO | Attending: Hematology | Admitting: Hematology

## 2023-03-17 ENCOUNTER — Encounter: Payer: Self-pay | Admitting: Hematology

## 2023-03-17 VITALS — BP 132/100 | HR 96 | Temp 98.3°F | Resp 16 | Wt 176.2 lb

## 2023-03-17 DIAGNOSIS — C20 Malignant neoplasm of rectum: Secondary | ICD-10-CM | POA: Insufficient documentation

## 2023-03-17 DIAGNOSIS — Z9221 Personal history of antineoplastic chemotherapy: Secondary | ICD-10-CM | POA: Insufficient documentation

## 2023-03-17 DIAGNOSIS — Z923 Personal history of irradiation: Secondary | ICD-10-CM | POA: Insufficient documentation

## 2023-03-17 DIAGNOSIS — E876 Hypokalemia: Secondary | ICD-10-CM | POA: Insufficient documentation

## 2023-03-17 DIAGNOSIS — Z79899 Other long term (current) drug therapy: Secondary | ICD-10-CM | POA: Insufficient documentation

## 2023-03-17 LAB — CBC WITH DIFFERENTIAL (CANCER CENTER ONLY)
Abs Immature Granulocytes: 0.01 10*3/uL (ref 0.00–0.07)
Basophils Absolute: 0 10*3/uL (ref 0.0–0.1)
Basophils Relative: 0 %
Eosinophils Absolute: 0.1 10*3/uL (ref 0.0–0.5)
Eosinophils Relative: 2 %
HCT: 42.4 % (ref 36.0–46.0)
Hemoglobin: 14.4 g/dL (ref 12.0–15.0)
Immature Granulocytes: 0 %
Lymphocytes Relative: 27 %
Lymphs Abs: 1.5 10*3/uL (ref 0.7–4.0)
MCH: 30.6 pg (ref 26.0–34.0)
MCHC: 34 g/dL (ref 30.0–36.0)
MCV: 90 fL (ref 80.0–100.0)
Monocytes Absolute: 0.7 10*3/uL (ref 0.1–1.0)
Monocytes Relative: 12 %
Neutro Abs: 3.3 10*3/uL (ref 1.7–7.7)
Neutrophils Relative %: 59 %
Platelet Count: 255 10*3/uL (ref 150–400)
RBC: 4.71 MIL/uL (ref 3.87–5.11)
RDW: 13 % (ref 11.5–15.5)
WBC Count: 5.6 10*3/uL (ref 4.0–10.5)
nRBC: 0 % (ref 0.0–0.2)

## 2023-03-17 LAB — CMP (CANCER CENTER ONLY)
ALT: 14 U/L (ref 0–44)
AST: 15 U/L (ref 15–41)
Albumin: 4.3 g/dL (ref 3.5–5.0)
Alkaline Phosphatase: 81 U/L (ref 38–126)
Anion gap: 7 (ref 5–15)
BUN: 12 mg/dL (ref 6–20)
CO2: 34 mmol/L — ABNORMAL HIGH (ref 22–32)
Calcium: 9.9 mg/dL (ref 8.9–10.3)
Chloride: 100 mmol/L (ref 98–111)
Creatinine: 0.98 mg/dL (ref 0.44–1.00)
GFR, Estimated: >60 mL/min (ref >60)
Glucose, Bld: 98 mg/dL (ref 70–99)
Potassium: 3.4 mmol/L — ABNORMAL LOW (ref 3.5–5.1)
Sodium: 141 mmol/L (ref 135–145)
Total Bilirubin: 0.5 mg/dL (ref <1.2)
Total Protein: 7.2 g/dL (ref 6.5–8.1)

## 2023-03-17 MED ORDER — POTASSIUM CHLORIDE CRYS ER 20 MEQ PO TBCR: 20.0000 meq | EXTENDED_RELEASE_TABLET | Freq: Two times a day (BID) | ORAL | 1 refills | Status: DC

## 2023-03-17 NOTE — Progress Notes (Signed)
[facility]   Telephone:([phone] Fax:(336) [phone]   Clinic Follow up Note   Patient Care Team: Marcelle Overlie, MD as PCP - General (Obstetrics and Gynecology) Romie Levee, MD as Consulting Physician (General Surgery) Malachy Mood, MD as Consulting Physician (Hematology) Pollyann Samples, NP as Nurse Practitioner (Nurse Practitioner) Antony Madura, MD as Consulting Physician (Neurology) Diagnostic Radiology & Imaging, [facility] as Radiologist (Diagnostic Radiology)  Date of Service:  03/17/2023  CHIEF COMPLAINT: f/u of rectal cancer  CURRENT THERAPY:  Cancer surveillance  Oncology History   Rectal cancer (HCC) G2, cT2N0M0 stage I -presented with rectal bleeding in 06/2021, present since 07/2019 but patient cancelled colonoscopy at that time. Colonoscopy on 06/18/21 by Dr. Adela Lank showed a palpable ~3 cm polyp, path revealed moderately differentiated adenocarcinoma invading submucosa, with close but negative margin. MMR normal. -Staging CT CAP on 07/02/21 was negative. -Staging pelvic MRI on 07/03/21 showed no identifiable lesion. -s/p concurrent chemoRT 07/15/21 - 09/02/21 with Xeloda. She tolerated treatment well with no significant side effects aside from irritation. -sigmoidoscopy on 11/25/21 showed small anal fissure, otherwise benign. -she saw Dr. Maisie Fus on 12/21/21, anoscope not performed at that time due to fissure. -restaging pelvis MRI on 03/09/2023 showed NED.    Assessment and Plan    Rectal Cancer No new symptoms. Recent pelvic MRI scan showed no enlarged lymph nodes or mass in the rectum. -She is clinically doing well, rectal exam was negative. -Continue cancer surveillance -Schedule follow-up in 3 months with a CT chest scan. -She is clinically doing well overall, had a mild intermittent rectal bleeding due to anal fistula, resolved now.  Mild occasional urinary incontinence and vaginal tightness all stable and manageable. -Recommend follow-up with  surgeon, Dr. Maisie Fus, for endoscope exam in the next couple of months.  Anal Fissure Recent bleeding during bowel movements, but patient reports it is improving with stool softeners and fiber. -Continue current management.  Hypokalemia Patient reports low potassium levels and is currently taking 1 potassium pill daily, but may need to increase to 2 daily. Patient is also on hydrochlorothiazide, which may be depleting potassium. -Check potassium levels today. -If potassium is still below 3.5, increase potassium intake to 2-3 times a day. -Recommend discussing with primary care physician about possibly switching blood pressure medication to avoid potassium depletion.  Plan  -Lab and MRI scan reviewed, NED -Continue regular mammograms for cancer screening. -Ensure patient schedules appointment with primary care physician to discuss blood pressure medication. -Schedule follow-up in 3 months with a CT chest scan. -Recommend follow-up with surgeon, Dr. Maisie Fus, for endoscope exam in the next couple of months.         SUMMARY OF ONCOLOGIC HISTORY: Oncology History Overview Note   Cancer Staging  Rectal cancer [facility]) Staging form: Colon and Rectum, AJCC 8th Edition - Clinical stage from 06/18/2021: Stage I (cT1, cN0, cM0) - Signed by Malachy Mood, MD on 07/11/2021 Stage prefix: Initial diagnosis Total positive nodes: 0 Histologic grade (G): G2 Histologic grading system: 4 grade system     Rectal cancer (HCC)  06/18/2021 Cancer Staging   Staging form: Colon and Rectum, AJCC 8th Edition - Clinical stage from 06/18/2021: Stage I (cT1, cN0, cM0) - Signed by Malachy Mood, MD on 07/11/2021 Stage prefix: Initial diagnosis Total positive nodes: 0 Histologic grade (G): G2 Histologic grading system: 4 grade system   06/18/2021 Procedure   Colonoscopy per Dr. Adela Lank, impression - Polypoid lesion found on digital rectal exam. - One diminutive polyp in the  ascending colon, removed with a cold biopsy  forceps. Resected and retrieved. - One 5 mm polyp in the sigmoid colon, removed with a cold snare. Resected and retrieved. - One large polyp in the distal rectum. Resected and retrieved as outlined. Injected/tattooed. Clips were placed. - Polypoid lesions in the distal rectum. Biopsied. - Internal hemorrhoids. - The examination was otherwise normal.   06/18/2021 Initial Biopsy   Diagnosis 1. Surgical [P], colon, distal rectal biopsy - CONDYLOMA ACUMINATA 2. Surgical [P], colon, ascending, sigmoid, polyp (2) - TUBULAR ADENOMA (2 OF 2 FRAGMENTS) - NO HIGH-GRADE DYSPLASIA OR MALIGNANCY IDENTIFIED 3. Surgical [P], colon, rectum, polyp (1) - ADENOCARCINOMA ARISING IN A TUBULAR ADENOMA WITH HIGH-GRADE DYSPLASIA - SEE COMMENT Microscopic Comment 3. The invasive adenocarcinoma appears moderately differentiated and invades the submucosa for a depth of 0.8 cm. The adenocarcinoma comes within 0.1 cm from the base of the polyp. High-grade features including lymphovascular space invasion, tumor budding or poor differentiation are not identified. Dr. Kenard Gower reviewed the case and agrees with the above diagnosis.   07/02/2021 Imaging   CT CAP IMPRESSION: Negative. No evidence of metastatic disease or other significant abnormality.   07/03/2021 Imaging   Pelvic MRI w/wo contrast IMPRESSION: Rectal adenocarcinoma T stage: T2.  Lesion not identified  Rectal adenocarcinoma N stage:  N0  Distance from tumor to the internal anal sphincter. Lesion not identified   07/08/2021 Initial Diagnosis   Rectal cancer [facility])      Discussed the use of AI scribe software for clinical note transcription with the patient, who gave verbal consent to proceed.  History of Present Illness   A 52 year old patient with a history of rectal cancer presents for a six-month follow-up. The patient reports no new health issues and overall feels well. She recently experienced some bleeding due to a rectal fissure, which she  believes is healing. She has been managing the fissure with stool softeners and increased dietary fiber. The patient also experiences occasional urinary incontiness when sneezing or coughing, and has been taught exercises to strengthen the pelvic floor. She continues to experience vaginal tightness as a side effect of radiation therapy, which she manages with a vaginal cream. [name] recent PET scan showed no enlarged lymph nodes or mass in the rectum. She also has a history of low potassium levels, which she is managing with potassium supplements. She is considering changing her blood pressure medication due to its potassium-depleting effects.         All other systems were reviewed with the patient and are negative.  MEDICAL HISTORY:  Past Medical History:  Diagnosis Date   Arthritis    Hypertension    Rectal cancer (HCC)    Skin cancer    basil top of heasd     SURGICAL HISTORY: Past Surgical History:  Procedure Laterality Date   PARTIAL HYSTERECTOMY  05/2020   due to heavy bleeding   SKIN CANCER EXCISION      I have reviewed the social history and family history with the patient and they are unchanged from previous note.  ALLERGIES:  has No Known Allergies.  MEDICATIONS:  Current Outpatient Medications  Medication Sig Dispense Refill   AMBULATORY NON FORMULARY MEDICATION Place 30 g rectally in the morning, at noon, and at bedtime. Medication Name: Diltiazem 2% w/ Lidocaine 2% ointment. Apply pea sized amount per rectum, 3 times daily to treat anal fissure. 30 g 1   buPROPion (WELLBUTRIN SR) 150 MG 12 hr tablet Take 150 mg by  mouth 2 (two) times daily.     busPIRone (BUSPAR) 15 MG tablet Take 15 mg by mouth daily in the afternoon.     estradiol (ESTRACE) 0.5 MG tablet Take 0.5 mg by mouth daily.     fluticasone (FLONASE) 50 MCG/ACT nasal spray Place 2 sprays into both nostrils daily. (Patient not taking: Reported on 06/11/2022) 16 g 0   hydrochlorothiazide (HYDRODIURIL) 50  MG tablet Take 1 tablet by mouth daily.     ibuprofen (ADVIL) 800 MG tablet      potassium chloride SA (KLOR-CON M20) 20 MEQ tablet Take 1 tablet (20 mEq total) by mouth 2 (two) times daily. 60 tablet 1   rizatriptan (MAXALT) 10 MG tablet TAKE 1 TABLET BY MOUTH AS NEEDED FOR MIGRAINE. MAY REPEAT IN 2 HOURS IF NEEDED 10 tablet 0   No current facility-administered medications for this visit.    PHYSICAL EXAMINATION: ECOG PERFORMANCE STATUS: 0 - Asymptomatic  Vitals:   03/17/23 1126  BP: (!) 132/100  Pulse: 96  Resp: 16  Temp: 98.3 F (36.8 C)  SpO2: 98%   Wt Readings from Last 3 Encounters:  03/17/23 176 lb 3.2 oz (79.9 kg)  09/14/22 175 lb 14.4 oz (79.8 kg)  07/01/22 160 lb (72.6 kg)     GENERAL:alert, no distress and comfortable SKIN: skin color, texture, turgor are normal, no rashes or significant lesions EYES: normal, Conjunctiva are pink and non-injected, sclera clear NECK: supple, thyroid normal size, non-tender, without nodularity LYMPH:  no palpable lymphadenopathy in the cervical, axillary  LUNGS: clear to auscultation and percussion with normal breathing effort HEART: regular rate & rhythm and no murmurs and no lower extremity edema ABDOMEN:abdomen soft, non-tender and normal bowel sounds Musculoskeletal:no cyanosis of digits and no clubbing  NEURO: alert & oriented x 3 with fluent speech, no focal motor/sensory deficits RECTAL: Presence of hemorrhoids. Increased anal sphincter tone.  No palpable rectal mass.     LABORATORY DATA:  I have reviewed the data as listed    Latest Ref Rng & Units 03/17/2023   11:13 AM 03/10/2023   11:02 AM 09/14/2022    7:45 AM  CBC  WBC 4.0 - 10.5 K/uL 5.6  4.1  4.6   Hemoglobin 12.0 - 15.0 g/dL 16.1  09.6  04.5   Hematocrit 36.0 - 46.0 % 42.4  42.4  40.6   Platelets 150 - 400 K/uL 255  249  215         Latest Ref Rng & Units 03/17/2023   11:13 AM 03/10/2023   11:02 AM 09/14/2022    7:45 AM  CMP  Glucose 70 - 99 mg/dL 98  409   92   BUN 6 - 20 mg/dL 12  15  13    Creatinine 0.44 - 1.00 mg/dL 8.11  9.14  7.82   Sodium 135 - 145 mmol/L 141  144  140   Potassium 3.5 - 5.1 mmol/L 3.4  2.8  3.3   Chloride 98 - 111 mmol/L 100  105  104   CO2 22 - 32 mmol/L 34  31  30   Calcium 8.9 - 10.3 mg/dL 9.9  9.6  8.8   Total Protein 6.5 - 8.1 g/dL 7.2  7.1  6.6   Total Bilirubin <1.2 mg/dL 0.5  0.4  0.4   Alkaline Phos 38 - 126 U/L 81  85  76   AST 15 - 41 U/L 15  14  13    ALT 0 - 44 U/L 14  15  11       RADIOGRAPHIC STUDIES: I have personally reviewed the radiological images as listed and agreed with the findings in the report. No results found.    No orders of the defined types were placed in this encounter.  All questions were answered. The patient knows to call the clinic with any problems, questions or concerns. No barriers to learning was detected. The total time spent in the appointment was 25 minutes.     Malachy Mood, MD 03/17/2023

## 2023-03-17 NOTE — Assessment & Plan Note (Signed)
G2, cT2N0M0 stage I -presented with rectal bleeding in 06/2021, present since 07/2019 but patient cancelled colonoscopy at that time. Colonoscopy on 06/18/21 by Dr. Adela Lank showed a palpable ~3 cm polyp, path revealed moderately differentiated adenocarcinoma invading submucosa, with close but negative margin. MMR normal. -Staging CT CAP on 07/02/21 was negative. -Staging pelvic MRI on 07/03/21 showed no identifiable lesion. -s/p concurrent chemoRT 07/15/21 - 09/02/21 with Xeloda. She tolerated treatment well with no significant side effects aside from irritation. -sigmoidoscopy on 11/25/21 showed small anal fissure, otherwise benign. -she saw Dr. Maisie Fus on 12/21/21, anoscope not performed at that time due to fissure. -restaging pelvis MRI on 03/09/2023 showed NED.

## 2023-05-17 ENCOUNTER — Encounter: Payer: Self-pay | Admitting: Hematology

## 2023-06-23 ENCOUNTER — Inpatient Hospital Stay: Payer: BC Managed Care – PPO | Attending: Hematology

## 2023-06-23 ENCOUNTER — Other Ambulatory Visit: Payer: Self-pay | Admitting: Hematology

## 2023-06-23 ENCOUNTER — Inpatient Hospital Stay (HOSPITAL_BASED_OUTPATIENT_CLINIC_OR_DEPARTMENT_OTHER): Payer: BC Managed Care – PPO | Admitting: Hematology

## 2023-06-23 ENCOUNTER — Encounter: Payer: Self-pay | Admitting: Hematology

## 2023-06-23 VITALS — BP 132/88 | HR 95 | Temp 97.6°F | Resp 16 | Wt 175.5 lb

## 2023-06-23 DIAGNOSIS — C20 Malignant neoplasm of rectum: Secondary | ICD-10-CM

## 2023-06-23 DIAGNOSIS — Z79899 Other long term (current) drug therapy: Secondary | ICD-10-CM | POA: Diagnosis not present

## 2023-06-23 DIAGNOSIS — Z87891 Personal history of nicotine dependence: Secondary | ICD-10-CM | POA: Diagnosis not present

## 2023-06-23 DIAGNOSIS — I10 Essential (primary) hypertension: Secondary | ICD-10-CM | POA: Diagnosis not present

## 2023-06-23 LAB — CBC WITH DIFFERENTIAL (CANCER CENTER ONLY)
Abs Immature Granulocytes: 0.01 10*3/uL (ref 0.00–0.07)
Basophils Absolute: 0 10*3/uL (ref 0.0–0.1)
Basophils Relative: 0 %
Eosinophils Absolute: 0.1 10*3/uL (ref 0.0–0.5)
Eosinophils Relative: 2 %
HCT: 42.7 % (ref 36.0–46.0)
Hemoglobin: 14.6 g/dL (ref 12.0–15.0)
Immature Granulocytes: 0 %
Lymphocytes Relative: 28 %
Lymphs Abs: 1.4 10*3/uL (ref 0.7–4.0)
MCH: 30.2 pg (ref 26.0–34.0)
MCHC: 34.2 g/dL (ref 30.0–36.0)
MCV: 88.4 fL (ref 80.0–100.0)
Monocytes Absolute: 0.6 10*3/uL (ref 0.1–1.0)
Monocytes Relative: 11 %
Neutro Abs: 2.8 10*3/uL (ref 1.7–7.7)
Neutrophils Relative %: 59 %
Platelet Count: 259 10*3/uL (ref 150–400)
RBC: 4.83 MIL/uL (ref 3.87–5.11)
RDW: 13.3 % (ref 11.5–15.5)
WBC Count: 4.9 10*3/uL (ref 4.0–10.5)
nRBC: 0 % (ref 0.0–0.2)

## 2023-06-23 LAB — CMP (CANCER CENTER ONLY)
ALT: 16 U/L (ref 0–44)
AST: 17 U/L (ref 15–41)
Albumin: 4.3 g/dL (ref 3.5–5.0)
Alkaline Phosphatase: 76 U/L (ref 38–126)
Anion gap: 7 (ref 5–15)
BUN: 14 mg/dL (ref 6–20)
CO2: 33 mmol/L — ABNORMAL HIGH (ref 22–32)
Calcium: 9.8 mg/dL (ref 8.9–10.3)
Chloride: 100 mmol/L (ref 98–111)
Creatinine: 1.09 mg/dL — ABNORMAL HIGH (ref 0.44–1.00)
GFR, Estimated: >60 mL/min (ref >60)
Glucose, Bld: 86 mg/dL (ref 70–99)
Potassium: 3.3 mmol/L — ABNORMAL LOW (ref 3.5–5.1)
Sodium: 140 mmol/L (ref 135–145)
Total Bilirubin: 0.5 mg/dL (ref 0.0–1.2)
Total Protein: 7.2 g/dL (ref 6.5–8.1)

## 2023-06-23 MED ORDER — POTASSIUM CHLORIDE CRYS ER 20 MEQ PO TBCR: 20.0000 meq | EXTENDED_RELEASE_TABLET | Freq: Every day | ORAL | 0 refills | Status: DC

## 2023-06-23 NOTE — Assessment & Plan Note (Signed)
G2, cT2N0M0 stage I -presented with rectal bleeding in 06/2021, present since 07/2019 but patient cancelled colonoscopy at that time. Colonoscopy on 06/18/21 by Dr. Adela Lank showed a palpable ~3 cm polyp, path revealed moderately differentiated adenocarcinoma invading submucosa, with close but negative margin. MMR normal. -Staging CT CAP on 07/02/21 was negative. -Staging pelvic MRI on 07/03/21 showed no identifiable lesion. -s/p concurrent chemoRT 07/15/21 - 09/02/21 with Xeloda. She tolerated treatment well with no significant side effects aside from irritation. -sigmoidoscopy on 11/25/21 showed small anal fissure, otherwise benign. -she saw Dr. Maisie Fus on 12/21/21, anoscope not performed at that time due to fissure. -restaging pelvis MRI on 03/09/2023 showed NED. Will repeat again in 6 months

## 2023-06-23 NOTE — Progress Notes (Signed)
[facility]   Telephone:(336) [phone] Fax:(336) [phone]   Clinic Follow up Note   Patient Care Team: Marcelle Overlie, MD as PCP - General (Obstetrics and Gynecology) Romie Levee, MD as Consulting Physician (General Surgery) Malachy Mood, MD as Consulting Physician (Hematology) Pollyann Samples, NP as Nurse Practitioner (Nurse Practitioner) Antony Madura, MD as Consulting Physician (Neurology) Diagnostic Radiology & Imaging, [facility] as Radiologist (Diagnostic Radiology)  Date of Service:  06/23/2023  CHIEF COMPLAINT: f/u of rectal cancer  CURRENT THERAPY:  Cancer surveillance  Oncology History   Rectal cancer (HCC) G2, cT2N0M0 stage I -presented with rectal bleeding in 06/2021, present since 07/2019 but patient cancelled colonoscopy at that time. Colonoscopy on 06/18/21 by Dr. Adela Lank showed a palpable ~3 cm polyp, path revealed moderately differentiated adenocarcinoma invading submucosa, with close but negative margin. MMR normal. -Staging CT CAP on 07/02/21 was negative. -Staging pelvic MRI on 07/03/21 showed no identifiable lesion. -s/p concurrent chemoRT 07/15/21 - 09/02/21 with Xeloda. She tolerated treatment well with no significant side effects aside from irritation. -sigmoidoscopy on 11/25/21 showed small anal fissure, otherwise benign. -she saw Dr. Maisie Fus on 12/21/21, anoscope not performed at that time due to fissure. -restaging pelvis MRI on 03/09/2023 showed NED. Will repeat again in 6 months     Assessment and Plan    Rectal Cancer Follow-up for rectal cancer diagnosed in February 2023. Reports no issues with bowel movements or pain. Fissure healing well per Dr. Maisie Fus. No chest or abdominal discomfort. Eating well. Last MRI three months ago; next scan in three months. Normal colonoscopy in 2024. Experiencing long-term side effects from chemo and radiation, including frequent urination, bowel movement issues, vaginal dryness, and fatigue. Consulting gynecologist  and nutritionist for these concerns. Discussed reduced recurrence risk over time. If next CT scan is clear, may reduce scan frequency. - Order CT scan for early May 2025 - Schedule MRI for early May 2025 - Follow-up visit one week after scans - Continue MRI every six months until three years post-diagnosis, then annually for one to two years - Total follow-up duration of five years  Hypertension Blood pressure medication recently changed due to concerns about potassium levels. Discontinued hydrochlorothiazide; now on a new diuretic. Awaiting potassium level results. - Monitor potassium levels - Update medication list  General Health Maintenance Starting regular exercise with bands and weights at home. Planning a vacation to [place] in a couple of months. - Encourage regular physical activity - Provide radiology phone number for scheduling scans  Plan -She is clinically doing well, lab reviewed, unremarkable. -Lab in 3 months with lab, CT chest, abdomen pelvis, and pelvic MRI 1 week before        SUMMARY OF ONCOLOGIC HISTORY: Oncology History Overview Note   Cancer Staging  Rectal cancer [facility]) Staging form: Colon and Rectum, AJCC 8th Edition - Clinical stage from 06/18/2021: Stage I (cT1, cN0, cM0) - Signed by Malachy Mood, MD on 07/11/2021 Stage prefix: Initial diagnosis Total positive nodes: 0 Histologic grade (G): G2 Histologic grading system: 4 grade system     Rectal cancer (HCC)  06/18/2021 Cancer Staging   Staging form: Colon and Rectum, AJCC 8th Edition - Clinical stage from 06/18/2021: Stage I (cT1, cN0, cM0) - Signed by Malachy Mood, MD on 07/11/2021 Stage prefix: Initial diagnosis Total positive nodes: 0 Histologic grade (G): G2 Histologic grading system: 4 grade system   06/18/2021 Procedure   Colonoscopy per Dr. Adela Lank, impression - Polypoid lesion found on digital rectal exam. - One  diminutive polyp in the ascending colon, removed with a cold biopsy forceps. Resected  and retrieved. - One 5 mm polyp in the sigmoid colon, removed with a cold snare. Resected and retrieved. - One large polyp in the distal rectum. Resected and retrieved as outlined. Injected/tattooed. Clips were placed. - Polypoid lesions in the distal rectum. Biopsied. - Internal hemorrhoids. - The examination was otherwise normal.   06/18/2021 Initial Biopsy   Diagnosis 1. Surgical [P], colon, distal rectal biopsy - CONDYLOMA ACUMINATA 2. Surgical [P], colon, ascending, sigmoid, polyp (2) - TUBULAR ADENOMA (2 OF 2 FRAGMENTS) - NO HIGH-GRADE DYSPLASIA OR MALIGNANCY IDENTIFIED 3. Surgical [P], colon, rectum, polyp (1) - ADENOCARCINOMA ARISING IN A TUBULAR ADENOMA WITH HIGH-GRADE DYSPLASIA - SEE COMMENT Microscopic Comment 3. The invasive adenocarcinoma appears moderately differentiated and invades the submucosa for a depth of 0.8 cm. The adenocarcinoma comes within 0.1 cm from the base of the polyp. High-grade features including lymphovascular space invasion, tumor budding or poor differentiation are not identified. Dr. Kenard Gower reviewed the case and agrees with the above diagnosis.   07/02/2021 Imaging   CT CAP IMPRESSION: Negative. No evidence of metastatic disease or other significant abnormality.   07/03/2021 Imaging   Pelvic MRI w/wo contrast IMPRESSION: Rectal adenocarcinoma T stage: T2.  Lesion not identified  Rectal adenocarcinoma N stage:  N0  Distance from tumor to the internal anal sphincter. Lesion not identified   07/08/2021 Initial Diagnosis   Rectal cancer [facility])      Discussed the use of AI scribe software for clinical note transcription with the patient, who gave verbal consent to proceed.  History of Present Illness   The patient, a 53 year old female with a history of rectal cancer diagnosed in February 2023, presents for a routine follow-up. She reports no issues with bowel movements and denies any chest or stomach discomfort. She mentions a previously diagnosed  fissure that is finally starting to heal, as confirmed by Dr. Maisie Fus. She also reports a change in her blood pressure medication due to concerns about her potassium levels. She has stopped taking hydrochlorothiazide and started a new diuretic. She expresses some concern about her weight and has scheduled an appointment with a nutritionist. She also mentions some long-term side effects from previous chemo and radiation treatments, including frequent urination, bowel movements, and vaginal dryness, which is being managed with the help of her gynecologist. She also reports feeling tired at times, which she attributes to a less active lifestyle since her cancer diagnosis.         All other systems were reviewed with the patient and are negative.  MEDICAL HISTORY:  Past Medical History:  Diagnosis Date   Arthritis    Hypertension    Rectal cancer (HCC)    Skin cancer    basil top of heasd     SURGICAL HISTORY: Past Surgical History:  Procedure Laterality Date   PARTIAL HYSTERECTOMY  05/2020   due to heavy bleeding   SKIN CANCER EXCISION      I have reviewed the social history and family history with the patient and they are unchanged from previous note.  ALLERGIES:  has no known allergies.  MEDICATIONS:  Current Outpatient Medications  Medication Sig Dispense Refill   AMBULATORY NON FORMULARY MEDICATION Place 30 g rectally in the morning, at noon, and at bedtime. Medication Name: Diltiazem 2% w/ Lidocaine 2% ointment. Apply pea sized amount per rectum, 3 times daily to treat anal fissure. 30 g 1   buPROPion (WELLBUTRIN SR)  150 MG 12 hr tablet Take 150 mg by mouth 2 (two) times daily.     busPIRone (BUSPAR) 15 MG tablet Take 15 mg by mouth daily in the afternoon.     estradiol (ESTRACE) 0.5 MG tablet Take 0.5 mg by mouth daily.     fluticasone (FLONASE) 50 MCG/ACT nasal spray Place 2 sprays into both nostrils daily. 16 g 0   ibuprofen (ADVIL) 800 MG tablet      rizatriptan (MAXALT) 10  MG tablet TAKE 1 TABLET BY MOUTH AS NEEDED FOR MIGRAINE. MAY REPEAT IN 2 HOURS IF NEEDED 10 tablet 0   triamterene-hydrochlorothiazide (MAXZIDE) 75-50 MG tablet Take 1 tablet by mouth daily.     hydrochlorothiazide (HYDRODIURIL) 50 MG tablet Take 1 tablet by mouth daily. (Patient not taking: Reported on 06/23/2023)     potassium chloride SA (KLOR-CON M20) 20 MEQ tablet Take 1 tablet (20 mEq total) by mouth 2 (two) times daily. (Patient not taking: Reported on 06/23/2023) 60 tablet 1   No current facility-administered medications for this visit.    PHYSICAL EXAMINATION: ECOG PERFORMANCE STATUS: 0 - Asymptomatic  Vitals:   06/23/23 1151  BP: 132/88  Pulse: 95  Resp: 16  Temp: 97.6 F (36.4 C)  SpO2: 96%   Wt Readings from Last 3 Encounters:  06/23/23 175 lb 8 oz (79.6 kg)  03/17/23 176 lb 3.2 oz (79.9 kg)  09/14/22 175 lb 14.4 oz (79.8 kg)     GENERAL:alert, no distress and comfortable SKIN: skin color, texture, turgor are normal, no rashes or significant lesions EYES: normal, Conjunctiva are pink and non-injected, sclera clear Musculoskeletal:no cyanosis of digits and no clubbing  NEURO: alert & oriented x 3 with fluent speech, no focal motor/sensory deficits  ATORY DATA:  I have reviewed the data as listed    Latest Ref Rng & Units 06/23/2023   11:36 AM 03/17/2023   11:13 AM 03/10/2023   11:02 AM  CBC  WBC 4.0 - 10.5 K/uL 4.9  5.6  4.1   Hemoglobin 12.0 - 15.0 g/dL 86.5  78.4  69.6   Hematocrit 36.0 - 46.0 % 42.7  42.4  42.4   Platelets 150 - 400 K/uL 259  255  249         Latest Ref Rng & Units 06/23/2023   11:36 AM 03/17/2023   11:13 AM 03/10/2023   11:02 AM  CMP  Glucose 70 - 99 mg/dL 86  98  295   BUN 6 - 20 mg/dL 14  12  15    Creatinine 0.44 - 1.00 mg/dL 2.84  1.32  4.40   Sodium 135 - 145 mmol/L 140  141  144   Potassium 3.5 - 5.1 mmol/L 3.3  3.4  2.8   Chloride 98 - 111 mmol/L 100  100  105   CO2 22 - 32 mmol/L 33  34  31   Calcium 8.9 - 10.3 mg/dL 9.8   9.9  9.6   Total Protein 6.5 - 8.1 g/dL 7.2  7.2  7.1   Total Bilirubin 0.0 - 1.2 mg/dL 0.5  0.5  0.4   Alkaline Phos 38 - 126 U/L 76  81  85   AST 15 - 41 U/L 17  15  14    ALT 0 - 44 U/L 16  14  15        RADIOGRAPHIC STUDIES: I have personally reviewed the radiological images as listed and agreed with the findings in the report. No results found.  Orders Placed This Encounter  Procedures   MR PELVIS WO CM RECTAL CA STAGING    Standing Status:   Future    Expected Date:   09/13/2023    Expiration Date:   06/22/2024    If indicated for the ordered procedure, I authorize the administration of contrast media per Radiology protocol:   Yes    What is the patient's sedation requirement?:   No Sedation    Does the patient have a pacemaker or implanted devices?:   No    Preferred imaging location?:   [facility] (table limit - 550 lbs)   CT CHEST ABDOMEN PELVIS W CONTRAST    Standing Status:   Future    Expected Date:   09/13/2023    Expiration Date:   06/22/2024    If indicated for the ordered procedure, I authorize the administration of contrast media per Radiology protocol:   Yes    Does the patient have a contrast media/X-ray dye allergy?:   No    Is patient pregnant?:   No    Preferred imaging location?:   [facility]    If indicated for the ordered procedure, I authorize the administration of oral contrast media per Radiology protocol:   Yes   All questions were answered. The patient knows to call the clinic with any problems, questions or concerns. No barriers to learning was detected. The total time spent in the appointment was 20 minutes.     Malachy Mood, MD 06/23/2023

## 2023-06-24 ENCOUNTER — Inpatient Hospital Stay: Payer: BC Managed Care – PPO | Admitting: Licensed Clinical Social Worker

## 2023-06-24 DIAGNOSIS — C20 Malignant neoplasm of rectum: Secondary | ICD-10-CM

## 2023-06-24 NOTE — Progress Notes (Signed)
[facility] CSW Progress Note  Clinical Child psychotherapist  contacted pt regarding inquiry for a Veterinary surgeon.  CSW emailed pt a list of possible counselors accepting pt's insurance.  Pt also provided w/ contact information for CSW should any additional questions arise.        Rachel Moulds, LCSW Clinical Social Worker [facility]

## 2023-07-12 ENCOUNTER — Other Ambulatory Visit: Payer: Self-pay | Admitting: Hematology

## 2023-09-12 ENCOUNTER — Other Ambulatory Visit: Payer: Self-pay

## 2023-09-12 DIAGNOSIS — C20 Malignant neoplasm of rectum: Secondary | ICD-10-CM

## 2023-09-13 ENCOUNTER — Ambulatory Visit (HOSPITAL_COMMUNITY)
Admission: RE | Admit: 2023-09-13 | Discharge: 2023-09-13 | Disposition: A | Discharge status: Home / Self Care | Source: Ambulatory Visit | Attending: Hematology | Admitting: Hematology

## 2023-09-13 ENCOUNTER — Inpatient Hospital Stay: Payer: BC Managed Care – PPO | Attending: Hematology

## 2023-09-13 DIAGNOSIS — C20 Malignant neoplasm of rectum: Secondary | ICD-10-CM | POA: Insufficient documentation

## 2023-09-13 LAB — CMP (CANCER CENTER ONLY)
ALT: 17 U/L (ref 0–44)
AST: 20 U/L (ref 15–41)
Albumin: 4.5 g/dL (ref 3.5–5.0)
Alkaline Phosphatase: 88 U/L (ref 38–126)
Anion gap: 10 (ref 5–15)
BUN: 15 mg/dL (ref 6–20)
CO2: 31 mmol/L (ref 22–32)
Calcium: 9.6 mg/dL (ref 8.9–10.3)
Chloride: 99 mmol/L (ref 98–111)
Creatinine: 1.25 mg/dL — ABNORMAL HIGH (ref 0.44–1.00)
GFR, Estimated: 51 mL/min — ABNORMAL LOW (ref >60)
Glucose, Bld: 89 mg/dL (ref 70–99)
Potassium: 3 mmol/L — ABNORMAL LOW (ref 3.5–5.1)
Sodium: 140 mmol/L (ref 135–145)
Total Bilirubin: 0.6 mg/dL (ref 0.0–1.2)
Total Protein: 7.3 g/dL (ref 6.5–8.1)

## 2023-09-13 LAB — CBC WITH DIFFERENTIAL (CANCER CENTER ONLY)
Abs Immature Granulocytes: 0.01 10*3/uL (ref 0.00–0.07)
Basophils Absolute: 0 10*3/uL (ref 0.0–0.1)
Basophils Relative: 1 %
Eosinophils Absolute: 0.1 10*3/uL (ref 0.0–0.5)
Eosinophils Relative: 2 %
HCT: 39.5 % (ref 36.0–46.0)
Hemoglobin: 13.6 g/dL (ref 12.0–15.0)
Immature Granulocytes: 0 %
Lymphocytes Relative: 30 %
Lymphs Abs: 1.9 10*3/uL (ref 0.7–4.0)
MCH: 29.7 pg (ref 26.0–34.0)
MCHC: 34.4 g/dL (ref 30.0–36.0)
MCV: 86.2 fL (ref 80.0–100.0)
Monocytes Absolute: 0.7 10*3/uL (ref 0.1–1.0)
Monocytes Relative: 12 %
Neutro Abs: 3.4 10*3/uL (ref 1.7–7.7)
Neutrophils Relative %: 55 %
Platelet Count: 274 10*3/uL (ref 150–400)
RBC: 4.58 MIL/uL (ref 3.87–5.11)
RDW: 13.1 % (ref 11.5–15.5)
WBC Count: 6.2 10*3/uL (ref 4.0–10.5)
nRBC: 0 % (ref 0.0–0.2)

## 2023-09-13 MED ORDER — SODIUM CHLORIDE (PF) 0.9 % IJ SOLN
INTRAMUSCULAR | Status: AC
  Filled 2023-09-13: qty 50

## 2023-09-13 MED ORDER — IOHEXOL 300 MG/ML  SOLN
100.0000 mL | Freq: Once | INTRAMUSCULAR | Status: AC | PRN
  Administered 2023-09-13: 100 mL via INTRAVENOUS

## 2023-09-16 ENCOUNTER — Telehealth: Payer: Self-pay

## 2023-09-16 NOTE — Telephone Encounter (Addendum)
 Reached out to the patient via telephone call, per Dr. Maryalice Smaller.  Went over provider's comments from below.  Patient verbalized understanding and did not have any further concerns or questions.    ----- Message from Sonja DISH sent at 09/16/2023  2:57 PM EDT ----- Please let pt know her K is low and Cr is slightly elevated, she needs to drink more fluids, electrolyte liquid is better, and increase oral K supplement, call in K if needed, 20meq bid for a week then daily   Sonja [place]

## 2023-09-18 NOTE — Progress Notes (Unsigned)
 [facility]     [phone] Fax:(336) [phone]    Patient Care Team: Thurman Flores, MD as PCP - General (Obstetrics and Gynecology) Joyce Nixon, MD as Consulting Physician (General Surgery) Sonja Hardin, MD as Consulting Physician (Hematology) Deolinda Frid K, NP as Nurse Practitioner (Nurse Practitioner) Ellene Gustin, MD as Consulting Physician (Neurology) Diagnostic Radiology & Imaging, [facility] as Radiologist (Diagnostic Radiology)   CHIEF COMPLAINT: Follow up rectal cancer   Oncology History Overview Note   Cancer Staging  Rectal cancer [facility]) Staging form: Colon and Rectum, AJCC 8th Edition - Clinical stage from 06/18/2021: Stage I (cT1, cN0, cM0) - Signed by Sonja Winsted, MD on 07/11/2021 Stage prefix: Initial diagnosis Total positive nodes: 0 Histologic grade (G): G2 Histologic grading system: 4 grade system     Rectal cancer (HCC)  06/18/2021 Cancer Staging   Staging form: Colon and Rectum, AJCC 8th Edition - Clinical stage from 06/18/2021: Stage I (cT1, cN0, cM0) - Signed by Sonja Alzada, MD on 07/11/2021 Stage prefix: Initial diagnosis Total positive nodes: 0 Histologic grade (G): G2 Histologic grading system: 4 grade system   06/18/2021 Procedure   Colonoscopy per Dr. General Kenner, impression - Polypoid lesion found on digital rectal exam. - One diminutive polyp in the ascending colon, removed with a cold biopsy forceps. Resected and retrieved. - One 5 mm polyp in the sigmoid colon, removed with a cold snare. Resected and retrieved. - One large polyp in the distal rectum. Resected and retrieved as outlined. Injected/tattooed. Clips were placed. - Polypoid lesions in the distal rectum. Biopsied. - Internal hemorrhoids. - The examination was otherwise normal.   06/18/2021 Initial Biopsy   Diagnosis 1. Surgical [P], colon, distal rectal biopsy - CONDYLOMA ACUMINATA 2. Surgical [P], colon, ascending, sigmoid, polyp (2) - TUBULAR ADENOMA (2 OF 2  FRAGMENTS) - NO HIGH-GRADE DYSPLASIA OR MALIGNANCY IDENTIFIED 3. Surgical [P], colon, rectum, polyp (1) - ADENOCARCINOMA ARISING IN A TUBULAR ADENOMA WITH HIGH-GRADE DYSPLASIA - SEE COMMENT Microscopic Comment 3. The invasive adenocarcinoma appears moderately differentiated and invades the submucosa for a depth of 0.8 cm. The adenocarcinoma comes within 0.1 cm from the base of the polyp. High-grade features including lymphovascular space invasion, tumor budding or poor differentiation are not identified. Dr. Bernetta Brilliant reviewed the case and agrees with the above diagnosis.   07/02/2021 Imaging   CT CAP IMPRESSION: Negative. No evidence of metastatic disease or other significant abnormality.   07/03/2021 Imaging   Pelvic MRI w/wo contrast IMPRESSION: Rectal adenocarcinoma T stage: T2.  Lesion not identified  Rectal adenocarcinoma N stage:  N0  Distance from tumor to the internal anal sphincter. Lesion not identified   07/08/2021 Initial Diagnosis   Rectal cancer (HCC)      CURRENT THERAPY: Surveillance   INTERVAL HISTORY Kayla Hobbs returns for follow up, last seen by Dr. Maryalice Hobbs 06/23/23. She had surveillance imaging 5/6.   ROS   Past Medical History:  Diagnosis Date   Arthritis    Hypertension    Rectal cancer (HCC)    Skin cancer    basil top of heasd      Past Surgical History:  Procedure Laterality Date   PARTIAL HYSTERECTOMY  05/2020   due to heavy bleeding   SKIN CANCER EXCISION       Outpatient Encounter Medications as of 09/19/2023  Medication Sig   AMBULATORY NON FORMULARY MEDICATION Place 30 g rectally in the morning, at noon, and at bedtime. Medication Name: Diltiazem  2% w/ Lidocaine 2% ointment. Apply pea sized amount per rectum, 3 times daily to treat anal fissure.   buPROPion (WELLBUTRIN SR) 150 MG 12 hr tablet Take 150 mg by mouth 2 (two) times daily.   busPIRone (BUSPAR) 15 MG tablet Take 15 mg by mouth daily in the afternoon.   estradiol (ESTRACE) 0.5 MG  tablet Take 0.5 mg by mouth daily.   fluticasone  (FLONASE ) 50 MCG/ACT nasal spray Place 2 sprays into both nostrils daily.   hydrochlorothiazide (HYDRODIURIL) 50 MG tablet Take 1 tablet by mouth daily. (Patient not taking: Reported on 06/23/2023)   ibuprofen (ADVIL) 800 MG tablet    potassium chloride  SA (KLOR-CON  M) 20 MEQ tablet TAKE 1 TABLET BY MOUTH EVERY DAY   rizatriptan  (MAXALT ) 10 MG tablet TAKE 1 TABLET BY MOUTH AS NEEDED FOR MIGRAINE. MAY REPEAT IN 2 HOURS IF NEEDED   triamterene-hydrochlorothiazide (MAXZIDE) 75-50 MG tablet Take 1 tablet by mouth daily.   No facility-administered encounter medications on file as of 09/19/2023.     There were no vitals filed for this visit. There is no height or weight on file to calculate BMI.   ECOG PERFORMANCE STATUS: {CHL ONC ECOG [phone]}  PHYSICAL EXAM GENERAL:alert, no distress and comfortable SKIN: no rash  EYES: sclera clear NECK: without mass LYMPH:  no palpable cervical or supraclavicular lymphadenopathy  LUNGS: clear with normal breathing effort HEART: regular rate & rhythm, no lower extremity edema ABDOMEN: abdomen soft, non-tender and normal bowel sounds NEURO: alert & oriented x 3 with fluent speech, no focal motor/sensory deficits Breast exam:  PAC without erythema    CBC    Latest Ref Rng & Units 09/13/2023    7:32 AM 06/23/2023   11:36 AM 03/17/2023   11:13 AM  CBC  WBC 4.0 - 10.5 K/uL 6.2  4.9  5.6   Hemoglobin 12.0 - 15.0 g/dL 16.1  09.6  04.5   Hematocrit 36.0 - 46.0 % 39.5  42.7  42.4   Platelets 150 - 400 K/uL 274  259  255       CMP     Latest Ref Rng & Units 09/13/2023    7:32 AM 06/23/2023   11:36 AM 03/17/2023   11:13 AM  CMP  Glucose 70 - 99 mg/dL 89  86  98   BUN 6 - 20 mg/dL 15  14  12    Creatinine 0.44 - 1.00 mg/dL 4.09  8.11  9.14   Sodium 135 - 145 mmol/L 140  140  141   Potassium 3.5 - 5.1 mmol/L 3.0  3.3  3.4   Chloride 98 - 111 mmol/L 99  100  100   CO2 22 - 32 mmol/L 31  33  34    Calcium 8.9 - 10.3 mg/dL 9.6  9.8  9.9   Total Protein 6.5 - 8.1 g/dL 7.3  7.2  7.2   Total Bilirubin 0.0 - 1.2 mg/dL 0.6  0.5  0.5   Alkaline Phos 38 - 126 U/L 88  76  81   AST 15 - 41 U/L 20  17  15    ALT 0 - 44 U/L 17  16  14        ASSESSMENT & PLAN:  Rectal cancer, G2, cT2N0M0 stage I -presented with rectal bleeding in 06/2021, present since 07/2019 but patient cancelled colonoscopy at that time. Colonoscopy on 06/18/21 by Dr. General Kenner showed a palpable ~3 cm polyp, path revealed moderately differentiated adenocarcinoma invading submucosa, with close but negative margin.  MMR normal. -Staging CT CAP on 07/02/21 was negative. -Staging pelvic MRI on 07/03/21 showed no identifiable lesion. -s/p concurrent chemoRT 07/15/21 - 09/02/21 with Xeloda . She tolerated treatment well -sigmoidoscopy on 11/25/21 showed small anal fissure, otherwise benign. -she saw Dr. Andy Bannister on 12/21/21, anoscope not performed at that time due to fissure. -restaging pelvic MRI on 03/09/2023 showed NED.      PLAN:  No orders of the defined types were placed in this encounter.     All questions were answered. The patient knows to call the clinic with any problems, questions or concerns. No barriers to learning were detected. I spent *** counseling the patient face to face. The total time spent in the appointment was *** and more than 50% was on counseling, review of test results, and coordination of care.   Arad Burston K Aristotelis Vilardi, NP 09/18/2023 5:51 PM

## 2023-09-19 ENCOUNTER — Encounter: Payer: Self-pay | Admitting: Nurse Practitioner

## 2023-09-19 ENCOUNTER — Inpatient Hospital Stay (HOSPITAL_BASED_OUTPATIENT_CLINIC_OR_DEPARTMENT_OTHER): Payer: BC Managed Care – PPO | Admitting: Nurse Practitioner

## 2023-09-19 VITALS — BP 123/93 | HR 94 | Temp 98.0°F | Resp 16 | Wt 154.2 lb

## 2023-09-19 DIAGNOSIS — C20 Malignant neoplasm of rectum: Secondary | ICD-10-CM | POA: Diagnosis not present

## 2023-10-20 ENCOUNTER — Other Ambulatory Visit: Payer: Self-pay | Admitting: Neurology

## 2023-10-20 DIAGNOSIS — G43009 Migraine without aura, not intractable, without status migrainosus: Secondary | ICD-10-CM

## 2023-10-21 NOTE — Telephone Encounter (Signed)
 Called and left a message on voice mail per Dr. Genita Keys

## 2023-12-13 ENCOUNTER — Inpatient Hospital Stay: Attending: Hematology

## 2023-12-13 ENCOUNTER — Inpatient Hospital Stay (HOSPITAL_BASED_OUTPATIENT_CLINIC_OR_DEPARTMENT_OTHER): Admitting: Hematology

## 2023-12-13 ENCOUNTER — Other Ambulatory Visit: Payer: Self-pay

## 2023-12-13 VITALS — BP 114/78 | HR 97 | Temp 98.4°F | Resp 16 | Wt 148.6 lb

## 2023-12-13 DIAGNOSIS — R7989 Other specified abnormal findings of blood chemistry: Secondary | ICD-10-CM | POA: Insufficient documentation

## 2023-12-13 DIAGNOSIS — I129 Hypertensive chronic kidney disease with stage 1 through stage 4 chronic kidney disease, or unspecified chronic kidney disease: Secondary | ICD-10-CM | POA: Insufficient documentation

## 2023-12-13 DIAGNOSIS — C20 Malignant neoplasm of rectum: Secondary | ICD-10-CM | POA: Diagnosis present

## 2023-12-13 DIAGNOSIS — Z79899 Other long term (current) drug therapy: Secondary | ICD-10-CM | POA: Diagnosis not present

## 2023-12-13 DIAGNOSIS — N189 Chronic kidney disease, unspecified: Secondary | ICD-10-CM | POA: Diagnosis not present

## 2023-12-13 DIAGNOSIS — Z9221 Personal history of antineoplastic chemotherapy: Secondary | ICD-10-CM | POA: Diagnosis not present

## 2023-12-13 DIAGNOSIS — Z87891 Personal history of nicotine dependence: Secondary | ICD-10-CM | POA: Diagnosis not present

## 2023-12-13 DIAGNOSIS — E876 Hypokalemia: Secondary | ICD-10-CM | POA: Insufficient documentation

## 2023-12-13 LAB — CBC WITH DIFFERENTIAL (CANCER CENTER ONLY)
Abs Immature Granulocytes: 0.01 K/uL (ref 0.00–0.07)
Basophils Absolute: 0 K/uL (ref 0.0–0.1)
Basophils Relative: 0 %
Eosinophils Absolute: 0.1 K/uL (ref 0.0–0.5)
Eosinophils Relative: 2 %
HCT: 41.1 % (ref 36.0–46.0)
Hemoglobin: 14.3 g/dL (ref 12.0–15.0)
Immature Granulocytes: 0 %
Lymphocytes Relative: 24 %
Lymphs Abs: 1.1 K/uL (ref 0.7–4.0)
MCH: 30.9 pg (ref 26.0–34.0)
MCHC: 34.8 g/dL (ref 30.0–36.0)
MCV: 88.8 fL (ref 80.0–100.0)
Monocytes Absolute: 0.5 K/uL (ref 0.1–1.0)
Monocytes Relative: 10 %
Neutro Abs: 2.8 K/uL (ref 1.7–7.7)
Neutrophils Relative %: 64 %
Platelet Count: 272 K/uL (ref 150–400)
RBC: 4.63 MIL/uL (ref 3.87–5.11)
RDW: 13.5 % (ref 11.5–15.5)
WBC Count: 4.5 K/uL (ref 4.0–10.5)
nRBC: 0 % (ref 0.0–0.2)

## 2023-12-13 LAB — CMP (CANCER CENTER ONLY)
ALT: 11 U/L (ref 0–44)
AST: 15 U/L (ref 15–41)
Albumin: 4.6 g/dL (ref 3.5–5.0)
Alkaline Phosphatase: 81 U/L (ref 38–126)
Anion gap: 8 (ref 5–15)
BUN: 18 mg/dL (ref 6–20)
CO2: 30 mmol/L (ref 22–32)
Calcium: 9.8 mg/dL (ref 8.9–10.3)
Chloride: 101 mmol/L (ref 98–111)
Creatinine: 1.4 mg/dL — ABNORMAL HIGH (ref 0.44–1.00)
GFR, Estimated: 45 mL/min — ABNORMAL LOW (ref >60)
Glucose, Bld: 91 mg/dL (ref 70–99)
Potassium: 3.4 mmol/L — ABNORMAL LOW (ref 3.5–5.1)
Sodium: 139 mmol/L (ref 135–145)
Total Bilirubin: 0.6 mg/dL (ref 0.0–1.2)
Total Protein: 7.5 g/dL (ref 6.5–8.1)

## 2023-12-13 LAB — CEA (ACCESS): CEA (CHCC): 1.77 ng/mL (ref 0.00–5.00)

## 2023-12-13 NOTE — Progress Notes (Signed)
 [facility]   Telephone:(336) [phone] Fax:(336) [phone]   Clinic Follow up Note   Patient Care Team: Mat Browning, MD as PCP - General (Obstetrics and Gynecology) Debby Hila, MD as Consulting Physician (General Surgery) Lanny Callander, MD as Consulting Physician (Hematology) Burton, Lacie K, NP as Nurse Practitioner (Nurse Practitioner) Leigh Venetia CROME, MD as Consulting Physician (Neurology) Diagnostic Radiology & Imaging, [facility] as Radiologist (Diagnostic Radiology)  Date of Service:  12/13/2023  CHIEF COMPLAINT: f/u of rectal cancer  CURRENT THERAPY:  Cancer surveillance  Oncology History   Rectal cancer (HCC) G2, cT2N0M0 stage I -presented with rectal bleeding in 06/2021, present since 07/2019 but patient cancelled colonoscopy at that time. Colonoscopy on 06/18/21 by Dr. Leigh showed a palpable ~3 cm polyp, path revealed moderately differentiated adenocarcinoma invading submucosa, with close but negative margin. MMR normal. -Staging CT CAP on 07/02/21 was negative. -Staging pelvic MRI on 07/03/21 showed no identifiable lesion. -s/p concurrent chemoRT 07/15/21 - 09/02/21 with Xeloda . She tolerated treatment well with no significant side effects aside from irritation. -sigmoidoscopy on 11/25/21 showed small anal fissure, otherwise benign. -she saw Dr. Debby on 12/21/21, anoscope not performed at that time due to fissure. -restaging pelvis MRI on 03/09/2023 and 09/13/2023 showed NED. Will repeat again in 6 months  -CT CAP on 09/13/2023 was negative   Assessment & Plan Rectal cancer, status post resection and chemoradiation, under surveillance Colon cancer diagnosed in February 2023, status post tumor resection and chemoradiation. Currently under surveillance with no evidence of recurrence. No new symptoms reported. Vaginal dryness managed with prescribed cream. - Order MRI in three months for surveillance - Continue annual CT scans for surveillance  Chronic kidney disease,  likely secondary to diuretic use Chronic kidney disease likely secondary to diuretic use. Creatinine level increased from 1.2 in May to 1.4 currently, indicating decreased kidney function. Diuretics, specifically Maxzide, are suspected to contribute to renal impairment. - Advise contacting primary care physician to discuss potential change in diuretic medication  Hypertension, well controlled on medication Hypertension is well controlled on current medication regimen, which includes diuretics.  Hypokalemia secondary to diuretic use Hypokalemia secondary to diuretic use. Current potassium level is 3.4, slightly improved. Potassium supplementation is being used to manage low potassium levels. Potential change in diuretic medication discussed to address hypokalemia. - Discuss potential change in diuretic medication with primary care physician to address hypokalemia   Plan - Lab reviewed, due to her persistent hypokalemia and increasing creatinine, I recommend her to stop Maxzide and call her PCP to adjust her HTN meds  - She otherwise doing well, no clinical concern for cancer recurrence - Follow-up in 3 months with lab and repeated pelvic MRI 1 week before  SUMMARY OF ONCOLOGIC HISTORY: Oncology History Overview Note   Cancer Staging  Rectal cancer [facility]) Staging form: Colon and Rectum, AJCC 8th Edition - Clinical stage from 06/18/2021: Stage I (cT1, cN0, cM0) - Signed by Lanny Callander, MD on 07/11/2021 Stage prefix: Initial diagnosis Total positive nodes: 0 Histologic grade (G): G2 Histologic grading system: 4 grade system     Rectal cancer (HCC)  06/18/2021 Cancer Staging   Staging form: Colon and Rectum, AJCC 8th Edition - Clinical stage from 06/18/2021: Stage I (cT1, cN0, cM0) - Signed by Lanny Callander, MD on 07/11/2021 Stage prefix: Initial diagnosis Total positive nodes: 0 Histologic grade (G): G2 Histologic grading system: 4 grade system   06/18/2021 Procedure   Colonoscopy per Dr.  Leigh, impression - Polypoid lesion found on digital  rectal exam. - One diminutive polyp in the ascending colon, removed with a cold biopsy forceps. Resected and retrieved. - One 5 mm polyp in the sigmoid colon, removed with a cold snare. Resected and retrieved. - One large polyp in the distal rectum. Resected and retrieved as outlined. Injected/tattooed. Clips were placed. - Polypoid lesions in the distal rectum. Biopsied. - Internal hemorrhoids. - The examination was otherwise normal.   06/18/2021 Initial Biopsy   Diagnosis 1. Surgical [P], colon, distal rectal biopsy - CONDYLOMA ACUMINATA 2. Surgical [P], colon, ascending, sigmoid, polyp (2) - TUBULAR ADENOMA (2 OF 2 FRAGMENTS) - NO HIGH-GRADE DYSPLASIA OR MALIGNANCY IDENTIFIED 3. Surgical [P], colon, rectum, polyp (1) - ADENOCARCINOMA ARISING IN A TUBULAR ADENOMA WITH HIGH-GRADE DYSPLASIA - SEE COMMENT Microscopic Comment 3. The invasive adenocarcinoma appears moderately differentiated and invades the submucosa for a depth of 0.8 cm. The adenocarcinoma comes within 0.1 cm from the base of the polyp. High-grade features including lymphovascular space invasion, tumor budding or poor differentiation are not identified. Dr. Rebbecca reviewed the case and agrees with the above diagnosis.   07/02/2021 Imaging   CT CAP IMPRESSION: Negative. No evidence of metastatic disease or other significant abnormality.   07/03/2021 Imaging   Pelvic MRI w/wo contrast IMPRESSION: Rectal adenocarcinoma T stage: T2.  Lesion not identified  Rectal adenocarcinoma N stage:  N0  Distance from tumor to the internal anal sphincter. Lesion not identified   07/08/2021 Initial Diagnosis   Rectal cancer [facility])      Discussed the use of AI scribe software for clinical note transcription with the patient, who gave verbal consent to proceed.  History of Present Illness Kayla Hobbs is a 53 year old female with colon cancer who presents for  follow-up.  Diagnosed with colon cancer in February 2023, she underwent tumor resection followed by chemotherapy and radiation. She has no new symptoms or complications from previous treatments. Her last MRI was in May.  Bowel movements are normal without blood in the stool. She intentionally lost almost thirty pounds before a trip to [place] and has maintained her weight since.  Creatinine levels have been elevated since February, with a recent increase from 1.2 in May to 1.4. She is on Maxzide for well-controlled hypertension, which may affect kidney function. Potassium supplements have slightly improved her levels to 3.4.     All other systems were reviewed with the patient and are negative.  MEDICAL HISTORY:  Past Medical History:  Diagnosis Date   Arthritis    Hypertension    Rectal cancer (HCC)    Skin cancer    basil top of heasd     SURGICAL HISTORY: Past Surgical History:  Procedure Laterality Date   PARTIAL HYSTERECTOMY  05/2020   due to heavy bleeding   SKIN CANCER EXCISION      I have reviewed the social history and family history with the patient and they are unchanged from previous note.  ALLERGIES:  has no known allergies.  MEDICATIONS:  Current Outpatient Medications  Medication Sig Dispense Refill   AMBULATORY NON FORMULARY MEDICATION Place 30 g rectally in the morning, at noon, and at bedtime. Medication Name: Diltiazem 2% w/ Lidocaine 2% ointment. Apply pea sized amount per rectum, 3 times daily to treat anal fissure. 30 g 1   buPROPion (WELLBUTRIN SR) 150 MG 12 hr tablet Take 150 mg by mouth 2 (two) times daily.     busPIRone (BUSPAR) 15 MG tablet Take 15 mg by mouth daily in the afternoon.  estradiol (ESTRACE) 0.5 MG tablet Take 0.5 mg by mouth daily.     fluticasone  (FLONASE ) 50 MCG/ACT nasal spray Place 2 sprays into both nostrils daily. 16 g 0   ibuprofen (ADVIL) 800 MG tablet      potassium chloride  SA (KLOR-CON  M) 20 MEQ tablet TAKE 1 TABLET BY  MOUTH EVERY DAY 30 tablet 0   rizatriptan  (MAXALT ) 10 MG tablet TAKE 1 TABLET BY MOUTH AS NEEDED FOR MIGRAINE. MAY REPEAT IN 2 HOURS IF NEEDED 10 tablet 0   triamterene-hydrochlorothiazide (MAXZIDE) 75-50 MG tablet Take 1 tablet by mouth daily.     No current facility-administered medications for this visit.    PHYSICAL EXAMINATION: ECOG PERFORMANCE STATUS: 0 - Asymptomatic  Vitals:   12/13/23 1053  BP: 114/78  Pulse: 97  Resp: 16  Temp: 98.4 F (36.9 C)  SpO2: 95%   Wt Readings from Last 3 Encounters:  12/13/23 148 lb 9.6 oz (67.4 kg)  09/19/23 154 lb 3.2 oz (69.9 kg)  06/23/23 175 lb 8 oz (79.6 kg)     GENERAL:alert, no distress and comfortable SKIN: skin color, texture, turgor are normal, no rashes or significant lesions EYES: normal, Conjunctiva are pink and non-injected, sclera clear NECK: supple, thyroid normal size, non-tender, without nodularity LYMPH:  no palpable lymphadenopathy in the cervical, axillary  LUNGS: clear to auscultation and percussion with normal breathing effort HEART: regular rate & rhythm and no murmurs and no lower extremity edema ABDOMEN:abdomen soft, non-tender and normal bowel sounds Musculoskeletal:no cyanosis of digits and no clubbing  NEURO: alert & oriented x 3 with fluent speech, no focal motor/sensory deficits  Physical Exam EXTREMITIES: No clubbing, cyanosis, or edema.  LABORATORY DATA:  I have reviewed the data as listed    Latest Ref Rng & Units 12/13/2023   10:34 AM 09/13/2023    7:32 AM 06/23/2023   11:36 AM  CBC  WBC 4.0 - 10.5 K/uL 4.5  6.2  4.9   Hemoglobin 12.0 - 15.0 g/dL 85.6  86.3  85.3   Hematocrit 36.0 - 46.0 % 41.1  39.5  42.7   Platelets 150 - 400 K/uL 272  274  259         Latest Ref Rng & Units 12/13/2023   10:34 AM 09/13/2023    7:32 AM 06/23/2023   11:36 AM  CMP  Glucose 70 - 99 mg/dL 91  89  86   BUN 6 - 20 mg/dL 18  15  14    Creatinine 0.44 - 1.00 mg/dL 8.59  8.74  8.90   Sodium 135 - 145 mmol/L 139  140   140   Potassium 3.5 - 5.1 mmol/L 3.4  3.0  3.3   Chloride 98 - 111 mmol/L 101  99  100   CO2 22 - 32 mmol/L 30  31  33   Calcium 8.9 - 10.3 mg/dL 9.8  9.6  9.8   Total Protein 6.5 - 8.1 g/dL 7.5  7.3  7.2   Total Bilirubin 0.0 - 1.2 mg/dL 0.6  0.6  0.5   Alkaline Phos 38 - 126 U/L 81  88  76   AST 15 - 41 U/L 15  20  17    ALT 0 - 44 U/L 11  17  16        RADIOGRAPHIC STUDIES: I have personally reviewed the radiological images as listed and agreed with the findings in the report. No results found.    Orders Placed This Encounter  Procedures  MR PELVIS WO CM RECTAL CA STAGING    Standing Status:   Future    Expected Date:   03/07/2024    Expiration Date:   12/12/2024    If indicated for the ordered procedure, I authorize the administration of contrast media per Radiology protocol:   Yes    What is the patient's sedation requirement?:   No Sedation    Does the patient have a pacemaker or implanted devices?:   No    Preferred imaging location?:   [facility] (table limit - 500lbs)   All questions were answered. The patient knows to call the clinic with any problems, questions or concerns. No barriers to learning was detected. The total time spent in the appointment was 25 minutes, including review of chart and various tests results, discussions about plan of care and coordination of care plan     Onita Mattock, MD 12/13/2023

## 2023-12-13 NOTE — Assessment & Plan Note (Signed)
 G2, cT2N0M0 stage I -presented with rectal bleeding in 06/2021, present since 07/2019 but patient cancelled colonoscopy at that time. Colonoscopy on 06/18/21 by Dr. Leigh showed a palpable ~3 cm polyp, path revealed moderately differentiated adenocarcinoma invading submucosa, with close but negative margin. MMR normal. -Staging CT CAP on 07/02/21 was negative. -Staging pelvic MRI on 07/03/21 showed no identifiable lesion. -s/p concurrent chemoRT 07/15/21 - 09/02/21 with Xeloda . She tolerated treatment well with no significant side effects aside from irritation. -sigmoidoscopy on 11/25/21 showed small anal fissure, otherwise benign. -she saw Dr. Debby on 12/21/21, anoscope not performed at that time due to fissure. -restaging pelvis MRI on 03/09/2023 and 09/13/2023 showed NED. Will repeat again in 6 months  -CT CAP on 09/13/2023 was negative

## 2023-12-18 ENCOUNTER — Other Ambulatory Visit: Payer: Self-pay | Admitting: Hematology

## 2024-03-07 ENCOUNTER — Inpatient Hospital Stay: Attending: Hematology

## 2024-03-07 ENCOUNTER — Ambulatory Visit (HOSPITAL_COMMUNITY)
Admission: RE | Admit: 2024-03-07 | Discharge: 2024-03-07 | Disposition: A | Discharge status: Home / Self Care | Source: Ambulatory Visit | Attending: Hematology | Admitting: Hematology

## 2024-03-07 DIAGNOSIS — C20 Malignant neoplasm of rectum: Secondary | ICD-10-CM | POA: Diagnosis present

## 2024-03-07 LAB — CBC WITH DIFFERENTIAL (CANCER CENTER ONLY)
Abs Immature Granulocytes: 0.01 K/uL (ref 0.00–0.07)
Basophils Absolute: 0 K/uL (ref 0.0–0.1)
Basophils Relative: 1 %
Eosinophils Absolute: 0.1 K/uL (ref 0.0–0.5)
Eosinophils Relative: 3 %
HCT: 38.3 % (ref 36.0–46.0)
Hemoglobin: 13.1 g/dL (ref 12.0–15.0)
Immature Granulocytes: 0 %
Lymphocytes Relative: 34 %
Lymphs Abs: 1.6 K/uL (ref 0.7–4.0)
MCH: 30.5 pg (ref 26.0–34.0)
MCHC: 34.2 g/dL (ref 30.0–36.0)
MCV: 89.1 fL (ref 80.0–100.0)
Monocytes Absolute: 0.5 K/uL (ref 0.1–1.0)
Monocytes Relative: 12 %
Neutro Abs: 2.3 K/uL (ref 1.7–7.7)
Neutrophils Relative %: 50 %
Platelet Count: 197 K/uL (ref 150–400)
RBC: 4.3 MIL/uL (ref 3.87–5.11)
RDW: 13.2 % (ref 11.5–15.5)
WBC Count: 4.6 K/uL (ref 4.0–10.5)
nRBC: 0 % (ref 0.0–0.2)

## 2024-03-07 LAB — CMP (CANCER CENTER ONLY)
ALT: 10 U/L (ref 0–44)
AST: 16 U/L (ref 15–41)
Albumin: 4.4 g/dL (ref 3.5–5.0)
Alkaline Phosphatase: 72 U/L (ref 38–126)
Anion gap: 6 (ref 5–15)
BUN: 11 mg/dL (ref 6–20)
CO2: 29 mmol/L (ref 22–32)
Calcium: 9.2 mg/dL (ref 8.9–10.3)
Chloride: 107 mmol/L (ref 98–111)
Creatinine: 1.08 mg/dL — ABNORMAL HIGH (ref 0.44–1.00)
GFR, Estimated: >60 mL/min (ref >60)
Glucose, Bld: 86 mg/dL (ref 70–99)
Potassium: 3.2 mmol/L — ABNORMAL LOW (ref 3.5–5.1)
Sodium: 142 mmol/L (ref 135–145)
Total Bilirubin: 0.4 mg/dL (ref 0.0–1.2)
Total Protein: 7 g/dL (ref 6.5–8.1)

## 2024-03-07 LAB — CEA (ACCESS): CEA (CHCC): 1.48 ng/mL (ref 0.00–5.00)

## 2024-03-13 NOTE — Assessment & Plan Note (Signed)
 G2, cT2N0M0 stage I -presented with rectal bleeding in 06/2021, present since 07/2019 but patient cancelled colonoscopy at that time. Colonoscopy on 06/18/21 by Dr. Leigh showed a palpable ~3 cm polyp, path revealed moderately differentiated adenocarcinoma invading submucosa, with close but negative margin. MMR normal. -Staging CT CAP on 07/02/21 was negative. -Staging pelvic MRI on 07/03/21 showed no identifiable lesion. -s/p concurrent chemoRT 07/15/21 - 09/02/21 with Xeloda . She tolerated treatment well with no significant side effects aside from irritation. -sigmoidoscopy on 11/25/21 showed small anal fissure, otherwise benign. -she saw Dr. Debby on 12/21/21, anoscope not performed at that time due to fissure. -restaging pelvis MRI on 03/09/2023 and 09/13/2023 showed NED. Will repeat again in 6 months  -CT CAP on 09/13/2023 was negative  -MRI 03/07/2024 was negative

## 2024-03-14 ENCOUNTER — Inpatient Hospital Stay

## 2024-03-14 ENCOUNTER — Inpatient Hospital Stay: Attending: Hematology | Admitting: Hematology

## 2024-03-14 VITALS — BP 120/80 | HR 75 | Temp 98.3°F | Resp 17 | Wt 152.4 lb

## 2024-03-14 DIAGNOSIS — E876 Hypokalemia: Secondary | ICD-10-CM | POA: Insufficient documentation

## 2024-03-14 DIAGNOSIS — Z87891 Personal history of nicotine dependence: Secondary | ICD-10-CM | POA: Diagnosis not present

## 2024-03-14 DIAGNOSIS — N189 Chronic kidney disease, unspecified: Secondary | ICD-10-CM | POA: Insufficient documentation

## 2024-03-14 DIAGNOSIS — C20 Malignant neoplasm of rectum: Secondary | ICD-10-CM | POA: Diagnosis not present

## 2024-03-14 DIAGNOSIS — I129 Hypertensive chronic kidney disease with stage 1 through stage 4 chronic kidney disease, or unspecified chronic kidney disease: Secondary | ICD-10-CM | POA: Insufficient documentation

## 2024-03-14 NOTE — Progress Notes (Signed)
 [facility]   [phone]) [phone] Fax:([phone]) [phone]   Clinic Follow up Note   Patient Care Team: Mat Browning, MD as PCP - General (Obstetrics and Gynecology) Debby Hila, MD as Consulting Physician (General Surgery) Lanny Callander, MD as Consulting Physician (Hematology) Burton, Lacie K, NP as Nurse Practitioner (Nurse Practitioner) Leigh Venetia CROME, MD as Consulting Physician (Neurology) Diagnostic Radiology & Imaging, [facility] as Radiologist (Diagnostic Radiology)  Date of Service:  03/14/2024  CHIEF COMPLAINT: f/u of rectal cancer  CURRENT THERAPY:  Cancer surveillance  Oncology History   Rectal cancer (HCC) G2, cT2N0M0 stage I -presented with rectal bleeding in 06/2021, present since 07/2019 but patient cancelled colonoscopy at that time. Colonoscopy on 06/18/21 by Dr. Leigh showed a palpable ~3 cm polyp, path revealed moderately differentiated adenocarcinoma invading submucosa, with close but negative margin. MMR normal. -Staging CT CAP on 07/02/21 was negative. -Staging pelvic MRI on 07/03/21 showed no identifiable lesion. -s/p concurrent chemoRT 07/15/21 - 09/02/21 with Xeloda . She tolerated treatment well with no significant side effects aside from irritation. -sigmoidoscopy on 11/25/21 showed small anal fissure, otherwise benign. -she saw Dr. Debby on 12/21/21, anoscope not performed at that time due to fissure. -restaging pelvis MRI on 03/09/2023 and 09/13/2023 showed NED. Will repeat again in 6 months  -CT CAP on 09/13/2023 was negative  -MRI 03/07/2024 was negative   Assessment & Plan Rectal cancer on surveillance Rectal cancer is under surveillance. Last colonoscopy was in February 2024, with the next one scheduled for February 2025. - Continue surveillance for rectal cancer. - Will schedule next colonoscopy for February 2025.  Hypokalemia Persistent hypokalemia despite discontinuation of blood pressure medication for three weeks. Potassium supplementation  resumed with one 20 mEq pill daily. No symptoms such as cramps reported. - Increase potassium supplementation to two 20 mEq pills daily for one week, then reduce to one pill daily. - Will repeat blood work in three weeks to assess potassium levels. - Discuss with primary care physician about adjusting blood pressure medication to preserve potassium levels.  Chronic kidney disease Well-managed with no acute issues. Creatinine levels may be affected by diuretics and dehydration. - Ensure adequate hydration and consider using water with electrolytes. - Will repeat kidney function tests in the next lab work.  Hypertension Managed with medication. Blood pressure is borderline, and medication was temporarily discontinued to assess its impact on potassium levels. - Continue current blood pressure management plan. - Discuss with primary care physician about potential adjustments to blood pressure medication.  Plan - She is clinically doing well, lab and MRI scan findings were reviewed with her, NED - Continue cancer surveillance, I will see her back in 3 months with lab, plan to repeat CT scan in 6 months - Will repeat her lab in 2 to 3 weeks for hypokalemia   SUMMARY OF ONCOLOGIC HISTORY: Oncology History Overview Note   Cancer Staging  Rectal cancer [facility]) Staging form: Colon and Rectum, AJCC 8th Edition - Clinical stage from 06/18/2021: Stage I (cT1, cN0, cM0) - Signed by Lanny Callander, MD on 07/11/2021 Stage prefix: Initial diagnosis Total positive nodes: 0 Histologic grade (G): G2 Histologic grading system: 4 grade system     Rectal cancer (HCC)  06/18/2021 Cancer Staging   Staging form: Colon and Rectum, AJCC 8th Edition - Clinical stage from 06/18/2021: Stage I (cT1, cN0, cM0) - Signed by Lanny Callander, MD on 07/11/2021 Stage prefix: Initial diagnosis Total positive nodes: 0 Histologic grade (G): G2 Histologic grading system: 4 grade system  06/18/2021 Procedure   Colonoscopy per Dr.  Leigh, impression - Polypoid lesion found on digital rectal exam. - One diminutive polyp in the ascending colon, removed with a cold biopsy forceps. Resected and retrieved. - One 5 mm polyp in the sigmoid colon, removed with a cold snare. Resected and retrieved. - One large polyp in the distal rectum. Resected and retrieved as outlined. Injected/tattooed. Clips were placed. - Polypoid lesions in the distal rectum. Biopsied. - Internal hemorrhoids. - The examination was otherwise normal.   06/18/2021 Initial Biopsy   Diagnosis 1. Surgical [P], colon, distal rectal biopsy - CONDYLOMA ACUMINATA 2. Surgical [P], colon, ascending, sigmoid, polyp (2) - TUBULAR ADENOMA (2 OF 2 FRAGMENTS) - NO HIGH-GRADE DYSPLASIA OR MALIGNANCY IDENTIFIED 3. Surgical [P], colon, rectum, polyp (1) - ADENOCARCINOMA ARISING IN A TUBULAR ADENOMA WITH HIGH-GRADE DYSPLASIA - SEE COMMENT Microscopic Comment 3. The invasive adenocarcinoma appears moderately differentiated and invades the submucosa for a depth of 0.8 cm. The adenocarcinoma comes within 0.1 cm from the base of the polyp. High-grade features including lymphovascular space invasion, tumor budding or poor differentiation are not identified. Dr. Rebbecca reviewed the case and agrees with the above diagnosis.   07/02/2021 Imaging   CT CAP IMPRESSION: Negative. No evidence of metastatic disease or other significant abnormality.   07/03/2021 Imaging   Pelvic MRI w/wo contrast IMPRESSION: Rectal adenocarcinoma T stage: T2.  Lesion not identified  Rectal adenocarcinoma N stage:  N0  Distance from tumor to the internal anal sphincter. Lesion not identified   07/08/2021 Initial Diagnosis   Rectal cancer [facility])      Discussed the use of AI scribe software for clinical note transcription with the patient, who gave verbal consent to proceed.  History of Present Illness A 53 year old female with rectal cancer presents for follow-up and surveillance.  She  is under surveillance for rectal cancer, diagnosed in 2023. Her last colonoscopy was in February 2024, with the next anticipated in February 2026. She reports no bowel movement issues.  She has ongoing hypokalemia despite discontinuing her blood pressure medication for three weeks. Her potassium levels remained low, leading her to resume the medication. She is currently taking potassium supplements, 20 mg once daily, and may increase the dose to twice daily if needed. No cramps or other symptoms related to low potassium are present.  She is trying to stay hydrated by drinking water and water with electrolytes.     All other systems were reviewed with the patient and are negative.  MEDICAL HISTORY:  Past Medical History:  Diagnosis Date   Arthritis    Hypertension    Rectal cancer (HCC)    Skin cancer    basil top of heasd     SURGICAL HISTORY: Past Surgical History:  Procedure Laterality Date   PARTIAL HYSTERECTOMY  05/2020   due to heavy bleeding   SKIN CANCER EXCISION      I have reviewed the social history and family history with the patient and they are unchanged from previous note.  ALLERGIES:  has no known allergies.  MEDICATIONS:  Current Outpatient Medications  Medication Sig Dispense Refill   AMBULATORY NON FORMULARY MEDICATION Place 30 g rectally in the morning, at noon, and at bedtime. Medication Name: Diltiazem 2% w/ Lidocaine 2% ointment. Apply pea sized amount per rectum, 3 times daily to treat anal fissure. 30 g 1   buPROPion (WELLBUTRIN SR) 150 MG 12 hr tablet Take 150 mg by mouth 2 (two) times daily.  busPIRone (BUSPAR) 15 MG tablet Take 15 mg by mouth daily in the afternoon.     estradiol (ESTRACE) 0.5 MG tablet Take 0.5 mg by mouth daily.     fluticasone  (FLONASE ) 50 MCG/ACT nasal spray Place 2 sprays into both nostrils daily. 16 g 0   ibuprofen (ADVIL) 800 MG tablet      potassium chloride  SA (KLOR-CON  M) 20 MEQ tablet TAKE 1 TABLET BY MOUTH EVERY DAY  90 tablet 1   rizatriptan  (MAXALT ) 10 MG tablet TAKE 1 TABLET BY MOUTH AS NEEDED FOR MIGRAINE. MAY REPEAT IN 2 HOURS IF NEEDED 10 tablet 0   triamterene-hydrochlorothiazide (MAXZIDE) 75-50 MG tablet Take 1 tablet by mouth daily.     No current facility-administered medications for this visit.    PHYSICAL EXAMINATION: ECOG PERFORMANCE STATUS: 0 - Asymptomatic  Vitals:   03/14/24 1116  BP: 120/80  Pulse: 75  Resp: 17  Temp: 98.3 F (36.8 C)  SpO2: 99%   Wt Readings from Last 3 Encounters:  03/14/24 152 lb 6.4 oz (69.1 kg)  12/13/23 148 lb 9.6 oz (67.4 kg)  09/19/23 154 lb 3.2 oz (69.9 kg)     GENERAL:alert, no distress and comfortable SKIN: skin color, texture, turgor are normal, no rashes or significant lesions EYES: normal, Conjunctiva are pink and non-injected, sclera clear Musculoskeletal:no cyanosis of digits and no clubbing  NEURO: alert & oriented x 3 with fluent speech, no focal motor/sensory deficits  Physical Exam   LABORATORY DATA:  I have reviewed the data as listed    Latest Ref Rng & Units 03/07/2024    2:01 PM 12/13/2023   10:34 AM 09/13/2023    7:32 AM  CBC  WBC 4.0 - 10.5 K/uL 4.6  4.5  6.2   Hemoglobin 12.0 - 15.0 g/dL 86.8  85.6  86.3   Hematocrit 36.0 - 46.0 % 38.3  41.1  39.5   Platelets 150 - 400 K/uL 197  272  274         Latest Ref Rng & Units 03/07/2024    2:01 PM 12/13/2023   10:34 AM 09/13/2023    7:32 AM  CMP  Glucose 70 - 99 mg/dL 86  91  89   BUN 6 - 20 mg/dL 11  18  15    Creatinine 0.44 - 1.00 mg/dL 8.91  8.59  8.74   Sodium 135 - 145 mmol/L 142  139  140   Potassium 3.5 - 5.1 mmol/L 3.2  3.4  3.0   Chloride 98 - 111 mmol/L 107  101  99   CO2 22 - 32 mmol/L 29  30  31    Calcium 8.9 - 10.3 mg/dL 9.2  9.8  9.6   Total Protein 6.5 - 8.1 g/dL 7.0  7.5  7.3   Total Bilirubin 0.0 - 1.2 mg/dL 0.4  0.6  0.6   Alkaline Phos 38 - 126 U/L 72  81  88   AST 15 - 41 U/L 16  15  20    ALT 0 - 44 U/L 10  11  17        RADIOGRAPHIC STUDIES: I  have personally reviewed the radiological images as listed and agreed with the findings in the report. No results found.    No orders of the defined types were placed in this encounter.  All questions were answered. The patient knows to call the clinic with any problems, questions or concerns. No barriers to learning was detected. The total time spent in the  appointment was 25 minutes, including review of chart and various tests results, discussions about plan of care and coordination of care plan     Onita Mattock, MD 03/14/2024

## 2024-03-30 ENCOUNTER — Inpatient Hospital Stay

## 2024-03-30 DIAGNOSIS — C20 Malignant neoplasm of rectum: Secondary | ICD-10-CM

## 2024-03-30 LAB — CMP (CANCER CENTER ONLY)
ALT: 12 U/L (ref 0–44)
AST: 20 U/L (ref 15–41)
Albumin: 4.4 g/dL (ref 3.5–5.0)
Alkaline Phosphatase: 82 U/L (ref 38–126)
Anion gap: 9 (ref 5–15)
BUN: 10 mg/dL (ref 6–20)
CO2: 27 mmol/L (ref 22–32)
Calcium: 9.8 mg/dL (ref 8.9–10.3)
Chloride: 105 mmol/L (ref 98–111)
Creatinine: 1.17 mg/dL — ABNORMAL HIGH (ref 0.44–1.00)
GFR, Estimated: 55 mL/min — ABNORMAL LOW (ref >60)
Glucose, Bld: 92 mg/dL (ref 70–99)
Potassium: 4.6 mmol/L (ref 3.5–5.1)
Sodium: 141 mmol/L (ref 135–145)
Total Bilirubin: 0.3 mg/dL (ref 0.0–1.2)
Total Protein: 7 g/dL (ref 6.5–8.1)

## 2024-03-30 LAB — CEA (ACCESS): CEA (CHCC): 2.07 ng/mL (ref 0.00–5.00)

## 2024-03-30 LAB — CBC WITH DIFFERENTIAL (CANCER CENTER ONLY)
Abs Immature Granulocytes: 0.01 K/uL (ref 0.00–0.07)
Basophils Absolute: 0 K/uL (ref 0.0–0.1)
Basophils Relative: 1 %
Eosinophils Absolute: 0.1 K/uL (ref 0.0–0.5)
Eosinophils Relative: 2 %
HCT: 40.2 % (ref 36.0–46.0)
Hemoglobin: 13.4 g/dL (ref 12.0–15.0)
Immature Granulocytes: 0 %
Lymphocytes Relative: 30 %
Lymphs Abs: 1.3 K/uL (ref 0.7–4.0)
MCH: 29.6 pg (ref 26.0–34.0)
MCHC: 33.3 g/dL (ref 30.0–36.0)
MCV: 88.9 fL (ref 80.0–100.0)
Monocytes Absolute: 0.5 K/uL (ref 0.1–1.0)
Monocytes Relative: 12 %
Neutro Abs: 2.3 K/uL (ref 1.7–7.7)
Neutrophils Relative %: 55 %
Platelet Count: 222 K/uL (ref 150–400)
RBC: 4.52 MIL/uL (ref 3.87–5.11)
RDW: 12.6 % (ref 11.5–15.5)
WBC Count: 4.2 K/uL (ref 4.0–10.5)
nRBC: 0 % (ref 0.0–0.2)

## 2024-06-08 NOTE — Assessment & Plan Note (Signed)
 G2, cT2N0M0 stage I -presented with rectal bleeding in 06/2021, present since 07/2019 but patient cancelled colonoscopy at that time. Colonoscopy on 06/18/21 by Dr. Leigh showed a palpable ~3 cm polyp, path revealed moderately differentiated adenocarcinoma invading submucosa, with close but negative margin. MMR normal. -Staging CT CAP on 07/02/21 was negative. -Staging pelvic MRI on 07/03/21 showed no identifiable lesion. -s/p concurrent chemoRT 07/15/21 - 09/02/21 with Xeloda . She tolerated treatment well with no significant side effects aside from irritation. -sigmoidoscopy on 11/25/21 showed small anal fissure, otherwise benign. -she saw Dr. Debby on 12/21/21, anoscope not performed at that time due to fissure. -restaging pelvis MRI on 03/09/2023 and 09/13/2023 showed NED. Will repeat again in 6 months  -CT CAP on 09/13/2023 was negative  -MRI 03/07/2024 was negative

## 2024-06-12 ENCOUNTER — Inpatient Hospital Stay: Admitting: Hematology

## 2024-06-12 ENCOUNTER — Inpatient Hospital Stay

## 2024-06-12 DIAGNOSIS — C20 Malignant neoplasm of rectum: Secondary | ICD-10-CM
